# Patient Record
Sex: Female | Born: 2000 | Race: White | Hispanic: No | Marital: Single | Smoking: Never smoker
Health system: Southern US, Community
[De-identification: ages and names within clinical notes are randomized; demographics above are authoritative.]

## PROBLEM LIST (undated history)

## (undated) DIAGNOSIS — N200 Calculus of kidney: Secondary | ICD-10-CM

## (undated) DIAGNOSIS — N390 Urinary tract infection, site not specified: Secondary | ICD-10-CM

## (undated) HISTORY — DX: Urinary tract infection, site not specified: N39.0

## (undated) HISTORY — PX: WISDOM TOOTH EXTRACTION: SHX21

## (undated) HISTORY — DX: Calculus of kidney: N20.0

---
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
## 2019-01-15 ENCOUNTER — Other Ambulatory Visit: Payer: Self-pay

## 2019-01-15 ENCOUNTER — Ambulatory Visit (INDEPENDENT_AMBULATORY_CARE_PROVIDER_SITE_OTHER): Payer: Managed Care, Other (non HMO) | Admitting: Obstetrics and Gynecology

## 2019-01-15 ENCOUNTER — Encounter: Payer: Self-pay | Admitting: Obstetrics and Gynecology

## 2019-01-15 VITALS — BP 100/80 | Ht 64.0 in | Wt 168.0 lb

## 2019-01-15 DIAGNOSIS — N649 Disorder of breast, unspecified: Secondary | ICD-10-CM

## 2019-01-15 DIAGNOSIS — L988 Other specified disorders of the skin and subcutaneous tissue: Secondary | ICD-10-CM

## 2019-01-15 NOTE — Progress Notes (Signed)
Kaylee Bates, Kaylee Bates   Chief Complaint  Patient presents with  . Breast exam    red mark then purple, no lump noticed, tender if touches only since before Thanksgiving    HPI:      Kaylee Bates is a 19 y.o. No obstetric history on file. who LMP was Patient's last menstrual period was 12/26/2018 (exact date)., presents today for NP breast exam. Pt noted a red area on the right breast that started before Thanksgiving. Has since turned purple. Very tender to touch, but doesn't hurt otherwise. No trauma to area, no mass or drainage from area. No nodule at sx onset. Pt tried to squeeze but nothing came out. No fevers, no nipple d/c. No new soaps/detergents. Line dries bras. No hx of breast issues in past. No FH breast/ovar cancer.  Due for annual. She is sex active.   Patient Active Problem List   Diagnosis Date Noted  . Neck pain 09/18/2012  . 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 Nonallopathic lesion of cervical region 09/18/2012  . Nonallopathic lesion of lumbosacral region 09/18/2012  . Nonallopathic lesion of thoracic region 09/18/2012    History reviewed. No pertinent surgical history.  Family History  Problem Relation Age of Onset  . Breast cancer Neg Hx   . Ovarian cancer Neg Hx     Social History   Socioeconomic History  . Marital status: Single    Spouse name: Not on file  . Number of children: Not on file  . Years of education: Not on file  . Highest education level: Not on file  Occupational History  . Not on file  Tobacco Use  . Smoking status: Never Smoker  . Smokeless tobacco: Never Used  Substance and Sexual Activity  . Alcohol use: No  . Drug use: Never  . Sexual activity: Yes    Birth control/protection: Pill  Other Topics Concern  . Not on file  Social History Narrative  . Not on file   Social Determinants of Health   Financial Resource Strain:   . Difficulty of Paying Living Expenses: Not on file  Food Insecurity:     . Worried About Programme researcher, broadcasting/film/video in the Last Year: Not on file  . Ran Out of Food in the Last Year: Not on file  Transportation Needs:   . Lack of Transportation (Medical): Not on file  . Lack of Transportation (Non-Medical): Not on file  Physical Activity:   . Days of Exercise per Week: Not on file  . Minutes of Exercise per Session: Not on file  Stress:   . Feeling of Stress : Not on file  Social Connections:   . Frequency of Communication with Friends and Family: Not on file  . Frequency of Social Gatherings with Friends and Family: Not on file  . Attends Religious Services: Not on file  . Active Member of Clubs or Organizations: Not on file  . Attends Banker Meetings: Not on file  . Marital Status: Not on file  Intimate Partner Violence:   . Fear of Current or Ex-Partner: Not on file  . Emotionally Abused: Not on file  . Physically Abused: Not on file  . Sexually Abused: Not on file    Outpatient Medications Prior to Visit  Medication Sig Dispense Refill  . levonorgestrel-ethinyl estradiol (NORDETTE) 0.15-30 MG-MCG tablet Take by mouth.     No facility-administered medications prior to visit.      ROS:  Review of Systems  Constitutional: Negative for fatigue, fever and unexpected weight change.  Respiratory: Negative for cough, shortness of breath and wheezing.   Cardiovascular: Negative for chest pain, palpitations and leg swelling.  Gastrointestinal: Negative for blood in stool, constipation, diarrhea, nausea and vomiting.  Endocrine: Negative for cold intolerance, heat intolerance and polyuria.  Genitourinary: Negative for dyspareunia, dysuria, flank pain, frequency, genital sores, hematuria, menstrual problem, pelvic pain, urgency, vaginal bleeding, vaginal discharge and vaginal pain.  Musculoskeletal: Negative for back pain, joint swelling and myalgias.  Skin: Negative for rash.  Neurological: Negative for dizziness, syncope, light-headedness,  numbness and headaches.  Hematological: Negative for adenopathy.  Psychiatric/Behavioral: Negative for agitation, confusion, sleep disturbance and suicidal ideas. The patient is not nervous/anxious.    BREAST: tenderness, skin change   OBJECTIVE:   Vitals:  BP 100/80   Ht 5\' 4"  (1.626 m)   Wt 168 lb (76.2 kg)   LMP 12/26/2018 (Exact Date)   BMI 28.84 kg/m   Physical Exam Vitals reviewed.  Pulmonary:     Effort: Pulmonary effort is normal.  Chest:     Breasts: Breasts are symmetrical.        Right: Skin change and tenderness present. No inverted nipple, mass or nipple discharge.        Left: No inverted nipple, mass, nipple discharge, skin change or tenderness.    Musculoskeletal:        General: Normal range of motion.     Cervical back: Normal range of motion.  Skin:    General: Skin is warm and dry.  Neurological:     General: No focal deficit present.     Mental Status: She is alert and oriented to person, place, and time.     Cranial Nerves: No cranial nerve deficit.  Psychiatric:        Mood and Affect: Mood normal.        Behavior: Behavior normal.        Thought Content: Thought content normal.        Judgment: Judgment normal.     Assessment/Plan: Skin lesion of breast--RT breast. Looks like resolving nodule but sx hx not consistent with that and not resolved after 6 wks. Try warm compresses, triple abx oint/lotion to keep moist. Double rinse bras. F/u at annual in 2 wks. If still no change, will refer to derm.     Return in about 2 weeks (around 01/29/2019) for annual/breast f/u.  Mays Paino B. Cashtyn Pouliot, PA-C 01/15/2019 3:50 PM

## 2019-01-15 NOTE — Patient Instructions (Signed)
I value your feedback and entrusting us with your care. If you get a [facility] patient survey, I would appreciate you taking the time to let us know about your experience today. Thank you!  As of December 20, 2018, your lab results will be released to your MyChart immediately, before I even have a chance to see them. Please give me time to review them and contact you if there are any abnormalities. Thank you for your patience.  

## 2019-01-29 ENCOUNTER — Other Ambulatory Visit: Payer: Self-pay

## 2019-01-29 ENCOUNTER — Ambulatory Visit (INDEPENDENT_AMBULATORY_CARE_PROVIDER_SITE_OTHER): Payer: Managed Care, Other (non HMO) | Admitting: Obstetrics and Gynecology

## 2019-01-29 ENCOUNTER — Other Ambulatory Visit (HOSPITAL_COMMUNITY)
Admission: RE | Admit: 2019-01-29 | Discharge: 2019-01-29 | Disposition: A | Discharge status: Home / Self Care | Payer: Managed Care, Other (non HMO) | Source: Ambulatory Visit | Attending: Obstetrics and Gynecology | Admitting: Obstetrics and Gynecology

## 2019-01-29 ENCOUNTER — Encounter: Payer: Self-pay | Admitting: Obstetrics and Gynecology

## 2019-01-29 VITALS — BP 104/64 | Ht 64.0 in | Wt 170.0 lb

## 2019-01-29 DIAGNOSIS — Z01419 Encounter for gynecological examination (general) (routine) without abnormal findings: Secondary | ICD-10-CM

## 2019-01-29 DIAGNOSIS — Z113 Encounter for screening for infections with a predominantly sexual mode of transmission: Secondary | ICD-10-CM | POA: Diagnosis not present

## 2019-01-29 DIAGNOSIS — N649 Disorder of breast, unspecified: Secondary | ICD-10-CM

## 2019-01-29 DIAGNOSIS — Z3041 Encounter for surveillance of contraceptive pills: Secondary | ICD-10-CM

## 2019-01-29 DIAGNOSIS — L988 Other specified disorders of the skin and subcutaneous tissue: Secondary | ICD-10-CM

## 2019-01-29 MED ORDER — LEVONORGESTREL-ETHINYL ESTRAD 0.15-30 MG-MCG PO TABS: 1.0000 | ORAL_TABLET | Freq: Every day | ORAL | 3 refills | Status: DC

## 2019-01-29 NOTE — Patient Instructions (Signed)
I value your feedback and entrusting us with your care. If you get a [facility] patient survey, I would appreciate you taking the time to let us know about your experience today. Thank you!  As of December 20, 2018, your lab results will be released to your MyChart immediately, before I even have a chance to see them. Please give me time to review them and contact you if there are any abnormalities. Thank you for your patience.  

## 2019-01-29 NOTE — Progress Notes (Signed)
PCP:  [facility]   Chief Complaint  Patient presents with  . Gynecologic Exam    HPI:      [name] is a 19 y.o. G0P0000 who LMP was Patient's last menstrual period was 01/23/2019 (exact date)., presents today for her annual examination.  Her menses are regular every 28-30 days, lasting 4 days.  Dysmenorrhea mild, occurring first 1-2 days of flow. She does not have intermenstrual bleeding.  Sex activity: single partner, contraception - OCP (estrogen/progesterone).  Last Pap: N/A due to age Hx of STDs: none   There is no FH of breast cancer. There is no FH of ovarian cancer. The patient does not do self-breast exams. Hx of skin lesion RT breast since Thanksgiving, eval by me 2 wks ago. Due for f/u today. Scabbed area is improving and going away.   Tobacco use: vapes daily, considering quitting Alcohol use: none No drug use.  Exercise: moderately active  She does not get adequate calcium and Vitamin D in her diet. Unsure about Gardasil vaccine.  Patient Active Problem List   Diagnosis Date Noted  . Neck pain 09/18/2012  . 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 Nonallopathic lesion of cervical region 09/18/2012  . Nonallopathic lesion of lumbosacral region 09/18/2012  . Nonallopathic lesion of thoracic region 09/18/2012    History reviewed. No pertinent surgical history.  Family History  Problem Relation Age of Onset  . Breast cancer Neg Hx   . Ovarian cancer Neg Hx     Social History   Socioeconomic History  . Marital status: Single    Spouse name: Not on file  . Number of children: Not on file  . Years of education: Not on file  . Highest education level: Not on file  Occupational History  . Not on file  Tobacco Use  . Smoking status: Never Smoker  . Smokeless tobacco: Never Used  Substance and Sexual Activity  . Alcohol use: No  . Drug use: Never  . Sexual activity: Yes    Birth control/protection: Pill  Other Topics  Concern  . Not on file  Social History Narrative  . Not on file   Social Determinants of Health   Financial Resource Strain:   . Difficulty of Paying Living Expenses: Not on file  Food Insecurity:   . Worried About Programme researcher, broadcasting/film/video in the Last Year: Not on file  . Ran Out of Food in the Last Year: Not on file  Transportation Needs:   . Lack of Transportation (Medical): Not on file  . Lack of Transportation (Non-Medical): Not on file  Physical Activity:   . Days of Exercise per Week: Not on file  . Minutes of Exercise per Session: Not on file  Stress:   . Feeling of Stress : Not on file  Social Connections:   . Frequency of Communication with Friends and Family: Not on file  . Frequency of Social Gatherings with Friends and Family: Not on file  . Attends Religious Services: Not on file  . Active Member of Clubs or Organizations: Not on file  . Attends Banker Meetings: Not on file  . Marital Status: Not on file  Intimate Partner Violence:   . Fear of Current or Ex-Partner: Not on file  . Emotionally Abused: Not on file  . Physically Abused: Not on file  . Sexually Abused: Not on file     Current Outpatient Medications:  .  levonorgestrel-ethinyl  estradiol (NORDETTE) 0.15-30 MG-MCG tablet, Take 1 tablet by mouth daily., Disp: 3 Package, Rfl: 3     ROS:  Review of Systems  Constitutional: Negative for fatigue, fever and unexpected weight change.  Respiratory: Negative for cough, shortness of breath and wheezing.   Cardiovascular: Negative for chest pain, palpitations and leg swelling.  Gastrointestinal: Negative for blood in stool, constipation, diarrhea, nausea and vomiting.  Endocrine: Negative for cold intolerance, heat intolerance and polyuria.  Genitourinary: Negative for dyspareunia, dysuria, flank pain, frequency, genital sores, hematuria, menstrual problem, pelvic pain, urgency, vaginal bleeding, vaginal discharge and vaginal pain.   Musculoskeletal: Negative for back pain, joint swelling and myalgias.  Skin: Negative for rash.  Neurological: Negative for dizziness, syncope, light-headedness, numbness and headaches.  Hematological: Negative for adenopathy.  Psychiatric/Behavioral: Negative for agitation, confusion, sleep disturbance and suicidal ideas. The patient is not nervous/anxious.   BREAST: No symptoms   Objective: BP 104/64   Ht 5\' 4"  (1.626 m)   Wt 170 lb (77.1 kg)   LMP 01/23/2019 (Exact Date)   BMI 29.18 kg/m    Physical Exam Constitutional:      Appearance: She is well-developed.  Genitourinary:     Vulva, vagina, cervix, uterus, right adnexa and left adnexa normal.     No vulval lesion or tenderness noted.     No vaginal discharge, erythema or tenderness.     No cervical polyp.     Uterus is not enlarged or tender.     No right or left adnexal mass present.     Right adnexa not tender.     Left adnexa not tender.  Neck:     Thyroid: No thyromegaly.  Cardiovascular:     Rate and Rhythm: Normal rate and regular rhythm.     Heart sounds: Normal heart sounds. No murmur.  Pulmonary:     Effort: Pulmonary effort is normal.     Breath sounds: Normal breath sounds.  Chest:     Breasts:        Right: No mass, nipple discharge, skin change or tenderness.        Left: No mass, nipple discharge, skin change or tenderness.    Abdominal:     Palpations: Abdomen is soft.     Tenderness: There is no abdominal tenderness. There is no guarding.  Musculoskeletal:        General: Normal range of motion.     Cervical back: Normal range of motion.  Neurological:     General: No focal deficit present.     Mental Status: She is alert and oriented to person, place, and time.     Cranial Nerves: No cranial nerve deficit.  Skin:    General: Skin is warm and dry.  Psychiatric:        Mood and Affect: Mood normal.        Behavior: Behavior normal.        Thought Content: Thought content normal.         Judgment: Judgment normal.  Vitals reviewed.     Assessment/Plan: Encounter for annual routine gynecological examination  Screening for STD (sexually transmitted disease) - Plan: Cervicovaginal ancillary only  Encounter for surveillance of contraceptive pills - Plan: levonorgestrel-ethinyl estradiol (NORDETTE) 0.15-30 MG-MCG tablet; OCP RF  Skin lesion of breast--resolving finally. F/u prn changes.  Meds ordered this encounter  Medications  . levonorgestrel-ethinyl estradiol (NORDETTE) 0.15-30 MG-MCG tablet    Sig: Take 1 tablet by mouth daily.    Dispense:  3  Package    Refill:  3    Order Specific Question:   Supervising Provider    Answer:   Gae Dry [[id number]]             GYN counsel adequate intake of calcium and vitamin D, diet and exercise, Gardasil      F/U  Return in about 1 year (around 01/29/2020).  Jany Buckwalter B. Edna Grover, PA-C 01/29/2019 2:11 PM

## 2019-01-31 LAB — CERVICOVAGINAL ANCILLARY ONLY
Chlamydia: NEGATIVE
Comment: NEGATIVE
Comment: NORMAL
Neisseria Gonorrhea: NEGATIVE

## 2019-03-19 ENCOUNTER — Encounter: Payer: Self-pay | Admitting: Obstetrics and Gynecology

## 2019-03-19 ENCOUNTER — Other Ambulatory Visit: Payer: Self-pay

## 2019-03-19 ENCOUNTER — Ambulatory Visit (INDEPENDENT_AMBULATORY_CARE_PROVIDER_SITE_OTHER): Payer: Managed Care, Other (non HMO) | Admitting: Obstetrics and Gynecology

## 2019-03-19 VITALS — BP 110/70 | Ht 64.0 in | Wt 164.0 lb

## 2019-03-19 DIAGNOSIS — R3915 Urgency of urination: Secondary | ICD-10-CM

## 2019-03-19 DIAGNOSIS — R300 Dysuria: Secondary | ICD-10-CM | POA: Diagnosis not present

## 2019-03-19 DIAGNOSIS — R350 Frequency of micturition: Secondary | ICD-10-CM

## 2019-03-19 DIAGNOSIS — R399 Unspecified symptoms and signs involving the genitourinary system: Secondary | ICD-10-CM

## 2019-03-19 LAB — POCT URINALYSIS DIPSTICK
Bilirubin, UA: NEGATIVE
Glucose, UA: NEGATIVE
Ketones, UA: NEGATIVE
Leukocytes, UA: NEGATIVE
Nitrite, UA: NEGATIVE
Protein, UA: NEGATIVE
Spec Grav, UA: 1.025 (ref 1.010–1.025)
pH, UA: 5 (ref 5.0–8.0)

## 2019-03-19 NOTE — Progress Notes (Signed)
[place]   Chief Complaint  Patient presents with  . Urinary Tract Infection    frequency and burning after urinating, no blood in urine x 1 week    HPI:      [name] is a 19 y.o. G0P0000 who LMP was Patient's last menstrual period was 02/20/2019 (exact date)., presents today for UTI sx of urinary frequency/dysuria/urgency for the past wk. No LBP, pelvic pain, hematuria, fevers, urine odor, vag sx. Sx better today. Pt states gets sx frequently but they resolve after 1-2 days. Only drinks water. Hx of frequent UTIs as a child. Sx not related to sex; usually occur after her period. No vag sx.  She is sex active, no dyspareunia. On OCPs. Starting period this wk but no vag bleeding yet.  Past Medical History:  Diagnosis Date  . UTI (urinary tract infection)    as a child    History reviewed. No pertinent surgical history.  Family History  Problem Relation Age of Onset  . Breast cancer Neg Hx   . Ovarian cancer Neg Hx     Social History   Socioeconomic History  . Marital status: Single    Spouse name: Not on file  . Number of children: Not on file  . Years of education: Not on file  . Highest education level: Not on file  Occupational History  . Not on file  Tobacco Use  . Smoking status: Never Smoker  . Smokeless tobacco: Never Used  Substance and Sexual Activity  . Alcohol use: No  . Drug use: Never  . Sexual activity: Yes    Birth control/protection: Pill  Other Topics Concern  . Not on file  Social History Narrative  . Not on file   Social Determinants of Health   Financial Resource Strain:   . Difficulty of Paying Living Expenses: Not on file  Food Insecurity:   . Worried About Programme researcher, broadcasting/film/video in the Last Year: Not on file  . Ran Out of Food in the Last Year: Not on file  Transportation Needs:   . Lack of Transportation (Medical): Not on file  . Lack of Transportation (Non-Medical): Not on file  Physical Activity:     . Days of Exercise per Week: Not on file  . Minutes of Exercise per Session: Not on file  Stress:   . Feeling of Stress : Not on file  Social Connections:   . Frequency of Communication with Friends and Family: Not on file  . Frequency of Social Gatherings with Friends and Family: Not on file  . Attends Religious Services: Not on file  . Active Member of Clubs or Organizations: Not on file  . Attends Banker Meetings: Not on file  . Marital Status: Not on file  Intimate Partner Violence:   . Fear of Current or Ex-Partner: Not on file  . Emotionally Abused: Not on file  . Physically Abused: Not on file  . Sexually Abused: Not on file    Outpatient Medications Prior to Visit  Medication Sig Dispense Refill  . levonorgestrel-ethinyl estradiol (NORDETTE) 0.15-30 MG-MCG tablet Take 1 tablet by mouth daily. 3 Package 3   No facility-administered medications prior to visit.      ROS:  Review of Systems  Constitutional: Negative for fever.  Gastrointestinal: Negative for blood in stool, constipation, diarrhea, nausea and vomiting.  Genitourinary: Positive for dysuria. Negative for dyspareunia, flank pain, frequency, hematuria, urgency, vaginal bleeding, vaginal  discharge and vaginal pain.  Musculoskeletal: Negative for back pain.  Skin: Negative for rash.  BREAST: No symptoms   OBJECTIVE:   Vitals:  BP 110/70   Ht 5\' 4"  (1.626 m)   Wt 164 lb (74.4 kg)   LMP 02/20/2019 (Exact Date)   BMI 28.15 kg/m   Physical Exam Vitals reviewed.  Constitutional:      Appearance: She is well-developed.  Pulmonary:     Effort: Pulmonary effort is normal.  Musculoskeletal:        General: Normal range of motion.     Cervical back: Normal range of motion.  Skin:    General: Skin is warm and dry.  Neurological:     General: No focal deficit present.     Mental Status: She is alert and oriented to person, place, and time.     Cranial Nerves: No cranial nerve deficit.   Psychiatric:        Mood and Affect: Mood normal.        Behavior: Behavior normal.        Thought Content: Thought content normal.        Judgment: Judgment normal.     Results: Results for orders placed or performed in visit on 03/19/19 (from the past 24 hour(s))  POCT Urinalysis Dipstick     Status: Normal   Collection Time: 03/19/19  3:38 PM  Result Value Ref Range   Color, UA yellow    Clarity, UA clear    Glucose, UA Negative Negative   Bilirubin, UA neg    Ketones, UA neg    Spec Grav, UA 1.025 1.010 - 1.025   Blood, UA trace    pH, UA 5.0 5.0 - 8.0   Protein, UA Negative Negative   Urobilinogen, UA     Nitrite, UA neg    Leukocytes, UA Negative Negative   Appearance     Odor       Assessment/Plan: UTI symptoms - Plan: POCT Urinalysis Dipstick, Urine Culture; Neg UA. Check C&S. Will f/u with results. F/u sooner if sx worsen. If neg, will refer to urology due to frequent sx. Pt to get clarification from mom as to why she had frequent UTIs as a child.    Return if symptoms worsen or fail to improve.  Corry Storie B. Acacia Latorre, PA-C 03/19/2019 3:41 PM

## 2019-03-19 NOTE — Patient Instructions (Signed)
I value your feedback and entrusting us with your care. If you get a [facility] patient survey, I would appreciate you taking the time to let us know about your experience today. Thank you!  As of December 20, 2018, your lab results will be released to your MyChart immediately, before I even have a chance to see them. Please give me time to review them and contact you if there are any abnormalities. Thank you for your patience.  

## 2019-03-21 ENCOUNTER — Telehealth: Payer: Self-pay | Admitting: Obstetrics and Gynecology

## 2019-03-21 LAB — URINE CULTURE

## 2019-03-21 NOTE — Telephone Encounter (Signed)
Patient is calling for labs results. Please advise. 

## 2019-03-21 NOTE — Progress Notes (Signed)
Pt aware. Says sx gone now. Will talk to mom about referral to urology.

## 2019-03-21 NOTE — Progress Notes (Signed)
Pls let pt know C&S neg for UTI. Does she want urology ref since she keeps getting sx? Thx.

## 2019-03-21 NOTE — Telephone Encounter (Signed)
Spoke to pt already.

## 2019-03-21 NOTE — Telephone Encounter (Signed)
Back to you

## 2019-03-21 NOTE — Progress Notes (Signed)
Called pt, no answer, LVMTRC. 

## 2019-07-19 ENCOUNTER — Encounter: Payer: Self-pay | Admitting: Obstetrics and Gynecology

## 2019-07-22 ENCOUNTER — Other Ambulatory Visit: Payer: Self-pay | Admitting: Obstetrics and Gynecology

## 2019-07-22 DIAGNOSIS — Z130 Encounter for screening for diseases of the blood and blood-forming organs and certain disorders involving the immune mechanism: Secondary | ICD-10-CM

## 2019-07-22 DIAGNOSIS — L659 Nonscarring hair loss, unspecified: Secondary | ICD-10-CM

## 2019-07-22 NOTE — Progress Notes (Signed)
Labs for hair loss.

## 2019-07-23 ENCOUNTER — Other Ambulatory Visit: Payer: Self-pay

## 2019-07-23 ENCOUNTER — Other Ambulatory Visit: Payer: Managed Care, Other (non HMO)

## 2019-07-23 DIAGNOSIS — L659 Nonscarring hair loss, unspecified: Secondary | ICD-10-CM

## 2019-07-23 DIAGNOSIS — Z130 Encounter for screening for diseases of the blood and blood-forming organs and certain disorders involving the immune mechanism: Secondary | ICD-10-CM

## 2019-07-24 LAB — IRON,TIBC AND FERRITIN PANEL
Ferritin: 61 ng/mL (ref 15–150)
Iron Saturation: 36 % (ref 15–55)
Iron: 97 ug/dL (ref 27–159)
Total Iron Binding Capacity: 269 ug/dL (ref 250–450)
UIBC: 172 ug/dL (ref 131–425)

## 2019-07-24 LAB — TSH+FREE T4
Free T4: 1.24 ng/dL (ref 0.82–1.77)
TSH: 1.35 u[IU]/mL (ref 0.450–4.500)

## 2020-03-18 ENCOUNTER — Other Ambulatory Visit: Payer: Self-pay | Admitting: Obstetrics and Gynecology

## 2020-03-18 DIAGNOSIS — Z3041 Encounter for surveillance of contraceptive pills: Secondary | ICD-10-CM

## 2020-03-18 MED ORDER — LEVONORGESTREL-ETHINYL ESTRAD 0.15-30 MG-MCG PO TABS: 1.0000 | ORAL_TABLET | Freq: Every day | ORAL | 0 refills | Status: DC

## 2020-03-18 NOTE — Addendum Note (Signed)
Addended by: Loran Senters D on: 03/18/2020 04:01 PM   Modules accepted: Orders

## 2020-03-18 NOTE — Telephone Encounter (Signed)
Pt calling; has scheduled appt for 3/30; needs refill of bc.  [phone] Pt aware refill eRx'd.

## 2020-04-08 ENCOUNTER — Encounter: Payer: Self-pay | Admitting: Obstetrics and Gynecology

## 2020-04-08 ENCOUNTER — Ambulatory Visit (INDEPENDENT_AMBULATORY_CARE_PROVIDER_SITE_OTHER): Payer: 59 | Admitting: Obstetrics and Gynecology

## 2020-04-08 ENCOUNTER — Other Ambulatory Visit (HOSPITAL_COMMUNITY)
Admission: RE | Admit: 2020-04-08 | Discharge: 2020-04-08 | Disposition: A | Discharge status: Home / Self Care | Payer: 59 | Source: Ambulatory Visit | Attending: Obstetrics and Gynecology | Admitting: Obstetrics and Gynecology

## 2020-04-08 ENCOUNTER — Other Ambulatory Visit: Payer: Self-pay

## 2020-04-08 VITALS — BP 120/70 | Ht 64.0 in | Wt 150.0 lb

## 2020-04-08 DIAGNOSIS — Z01419 Encounter for gynecological examination (general) (routine) without abnormal findings: Secondary | ICD-10-CM | POA: Diagnosis not present

## 2020-04-08 DIAGNOSIS — Z113 Encounter for screening for infections with a predominantly sexual mode of transmission: Secondary | ICD-10-CM | POA: Insufficient documentation

## 2020-04-08 DIAGNOSIS — Z3041 Encounter for surveillance of contraceptive pills: Secondary | ICD-10-CM | POA: Diagnosis not present

## 2020-04-08 MED ORDER — LEVONORGESTREL-ETHINYL ESTRAD 0.15-30 MG-MCG PO TABS: 1.0000 | ORAL_TABLET | Freq: Every day | ORAL | 3 refills | Status: DC

## 2020-04-08 NOTE — Patient Instructions (Signed)
I value your feedback and you entrusting us with your care. If you get a [facility] patient survey, I would appreciate you taking the time to let us know about your experience today. Thank you! ? ? ?

## 2020-04-08 NOTE — Progress Notes (Signed)
PCP:  [facility]   Chief Complaint  Patient presents with  . Gynecologic Exam    No concerns    HPI:      [name] is a 20 y.o. G0P0000 who LMP was Patient's last menstrual period was 03/17/2020 (exact date)., presents today for her annual examination.  Her menses are regular every 28-30 days, lasting 2-4 days.  Dysmenorrhea mild, occurring first 1-2 days of flow. She does not have intermenstrual bleeding.  Sex activity: single partner, contraception - OCP (estrogen/progesterone).  Last Pap: N/A due to age Hx of STDs: none   There is no FH of breast cancer. There is no FH of ovarian cancer. The patient does not do self-breast exams.   Tobacco use: none Alcohol use: none No drug use.  Exercise: moderately active  She does get adequate calcium and Vitamin D in her diet. Unsure if Gardasil vaccine done--used to go to [employer].  Patient Active Problem List   Diagnosis Date Noted  . Neck pain 09/18/2012  . 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 Nonallopathic lesion of cervical region 09/18/2012  . Nonallopathic lesion of lumbosacral region 09/18/2012  . Nonallopathic lesion of thoracic region 09/18/2012    History reviewed. No pertinent surgical history.  Family History  Problem Relation Age of Onset  . Bladder Cancer Maternal Grandmother   . Lung cancer Maternal Great-grandmother   . Brain cancer Maternal Great-grandmother   . Breast cancer Neg Hx   . Ovarian cancer Neg Hx     Social History   Socioeconomic History  . Marital status: Single    Spouse name: Not on file  . Number of children: Not on file  . Years of education: Not on file  . Highest education level: Not on file  Occupational History  . Not on file  Tobacco Use  . Smoking status: Never Smoker  . Smokeless tobacco: Never Used  Vaping Use  . Vaping Use: Every day  Substance and Sexual Activity  . Alcohol use: No  . Drug use: Never  . Sexual activity: Yes     Birth control/protection: Pill  Other Topics Concern  . Not on file  Social History Narrative  . Not on file   Social Determinants of Health   Financial Resource Strain: Not on file  Food Insecurity: Not on file  Transportation Needs: Not on file  Physical Activity: Not on file  Stress: Not on file  Social Connections: Not on file  Intimate Partner Violence: Not on file     Current Outpatient Medications:  .  levonorgestrel-ethinyl estradiol (NORDETTE) 0.15-30 MG-MCG tablet, Take 1 tablet by mouth daily., Disp: 84 tablet, Rfl: 3     ROS:  Review of Systems  Constitutional: Negative for fatigue, fever and unexpected weight change.  Respiratory: Negative for cough, shortness of breath and wheezing.   Cardiovascular: Negative for chest pain, palpitations and leg swelling.  Gastrointestinal: Negative for blood in stool, constipation, diarrhea, nausea and vomiting.  Endocrine: Negative for cold intolerance, heat intolerance and polyuria.  Genitourinary: Negative for dyspareunia, dysuria, flank pain, frequency, genital sores, hematuria, menstrual problem, pelvic pain, urgency, vaginal bleeding, vaginal discharge and vaginal pain.  Musculoskeletal: Negative for back pain, joint swelling and myalgias.  Skin: Negative for rash.  Neurological: Negative for dizziness, syncope, light-headedness, numbness and headaches.  Hematological: Negative for adenopathy.  Psychiatric/Behavioral: Negative for agitation, confusion, sleep disturbance and suicidal ideas. The patient is not nervous/anxious.   BREAST:  No symptoms   Objective: BP 120/70   Ht 5\' 4"  (1.626 m)   Wt 150 lb (68 kg)   LMP 03/17/2020 (Exact Date)   BMI 25.75 kg/m    Physical Exam Constitutional:      Appearance: She is well-developed.  Genitourinary:     Vulva normal.     Right Labia: No rash, tenderness or lesions.    Left Labia: No tenderness, lesions or rash.    No vaginal discharge, erythema or tenderness.       Right Adnexa: not tender and no mass present.    Left Adnexa: not tender and no mass present.    No cervical friability or polyp.     Uterus is not enlarged or tender.  Breasts:     Right: No mass, nipple discharge, skin change or tenderness.     Left: No mass, nipple discharge, skin change or tenderness.    Neck:     Thyroid: No thyromegaly.  Cardiovascular:     Rate and Rhythm: Normal rate and regular rhythm.     Heart sounds: Normal heart sounds. No murmur heard.   Pulmonary:     Effort: Pulmonary effort is normal.     Breath sounds: Normal breath sounds.  Abdominal:     Palpations: Abdomen is soft.     Tenderness: There is no abdominal tenderness. There is no guarding or rebound.  Musculoskeletal:        General: Normal range of motion.     Cervical back: Normal range of motion.  Lymphadenopathy:     Cervical: No cervical adenopathy.  Neurological:     General: No focal deficit present.     Mental Status: She is alert and oriented to person, place, and time.     Cranial Nerves: No cranial nerve deficit.  Skin:    General: Skin is warm and dry.  Psychiatric:        Mood and Affect: Mood normal.        Behavior: Behavior normal.        Thought Content: Thought content normal.        Judgment: Judgment normal.  Vitals reviewed.     Assessment/Plan: Encounter for annual routine gynecological examination  Screening for STD (sexually transmitted disease) - Plan: Cervicovaginal ancillary only  Encounter for surveillance of contraceptive pills - Plan: levonorgestrel-ethinyl estradiol (NORDETTE) 0.15-30 MG-MCG tablet; OCP RF   Meds ordered this encounter  Medications  . levonorgestrel-ethinyl estradiol (NORDETTE) 0.15-30 MG-MCG tablet    Sig: Take 1 tablet by mouth daily.    Dispense:  84 tablet    Refill:  3    Order Specific Question:   Supervising Provider    Answer:   05-19-2003 Nadara Mustard             GYN counsel adequate intake of calcium and  vitamin D, diet and exercise, Gardasil--recommended pt get copy of vaccine records from PCP      F/U  Return in about 1 year (around 04/08/2021).  Juanito Gonyer B. Demetrice Combes, PA-C 04/08/2020 2:22 PM

## 2020-04-10 LAB — CERVICOVAGINAL ANCILLARY ONLY
Chlamydia: NEGATIVE
Comment: NEGATIVE
Comment: NORMAL
Neisseria Gonorrhea: NEGATIVE

## 2020-05-25 ENCOUNTER — Emergency Department: Payer: 59

## 2020-05-25 ENCOUNTER — Other Ambulatory Visit: Payer: Self-pay

## 2020-05-25 ENCOUNTER — Encounter: Payer: Self-pay | Admitting: Emergency Medicine

## 2020-05-25 ENCOUNTER — Emergency Department
Admission: EM | Admit: 2020-05-25 | Discharge: 2020-05-25 | Disposition: A | Discharge status: Home / Self Care | Payer: 59 | Attending: Emergency Medicine | Admitting: Emergency Medicine

## 2020-05-25 DIAGNOSIS — M549 Dorsalgia, unspecified: Secondary | ICD-10-CM

## 2020-05-25 DIAGNOSIS — N200 Calculus of kidney: Secondary | ICD-10-CM | POA: Insufficient documentation

## 2020-05-25 DIAGNOSIS — M545 Low back pain, unspecified: Secondary | ICD-10-CM | POA: Diagnosis present

## 2020-05-25 LAB — URINALYSIS, COMPLETE (UACMP) WITH MICROSCOPIC
Bilirubin Urine: NEGATIVE
Glucose, UA: NEGATIVE mg/dL
Ketones, ur: 80 mg/dL — AB
Leukocytes,Ua: NEGATIVE
Nitrite: NEGATIVE
Protein, ur: NEGATIVE mg/dL
RBC / HPF: >50 RBC/hpf — ABNORMAL HIGH (ref 0–5)
Specific Gravity, Urine: 1.026 (ref 1.005–1.030)
pH: 5 (ref 5.0–8.0)

## 2020-05-25 LAB — POC URINE PREG, ED: Preg Test, Ur: NEGATIVE

## 2020-05-25 MED ORDER — ONDANSETRON HCL 4 MG/2ML IJ SOLN
4.0000 mg | Freq: Once | INTRAMUSCULAR | Status: AC
  Administered 2020-05-25: 4 mg via INTRAVENOUS
  Filled 2020-05-25: qty 2

## 2020-05-25 MED ORDER — OXYCODONE-ACETAMINOPHEN 5-325 MG PO TABS: 1.0000 | ORAL_TABLET | ORAL | 0 refills | Status: DC | PRN

## 2020-05-25 MED ORDER — TAMSULOSIN HCL 0.4 MG PO CAPS: 0.4000 mg | ORAL_CAPSULE | Freq: Once | ORAL | 0 refills | Status: AC

## 2020-05-25 MED ORDER — KETOROLAC TROMETHAMINE 10 MG PO TABS: 10.0000 mg | ORAL_TABLET | Freq: Four times a day (QID) | ORAL | 0 refills | Status: DC | PRN

## 2020-05-25 MED ORDER — MORPHINE SULFATE (PF) 4 MG/ML IV SOLN
4.0000 mg | Freq: Once | INTRAVENOUS | Status: AC
  Administered 2020-05-25: 4 mg via INTRAVENOUS
  Filled 2020-05-25: qty 1

## 2020-05-25 MED ORDER — SODIUM CHLORIDE 0.9 % IV BOLUS
1000.0000 mL | Freq: Once | INTRAVENOUS | Status: AC
  Administered 2020-05-25: 1000 mL via INTRAVENOUS

## 2020-05-25 MED ORDER — KETOROLAC TROMETHAMINE 30 MG/ML IJ SOLN
30.0000 mg | Freq: Once | INTRAMUSCULAR | Status: AC
  Administered 2020-05-25: 30 mg via INTRAVENOUS
  Filled 2020-05-25: qty 1

## 2020-05-25 NOTE — Discharge Instructions (Signed)
Follow-up with your regular doctor as needed. Follow-up with Dr. Apolinar Junes if you have not passed the stone by Friday Drink plenty of water Add lemon to your water Take the Toradol and Flomax as prescribed.  Percocet for pain not controlled by these medications

## 2020-05-25 NOTE — ED Notes (Signed)
Pain meds given.   Family with pt.   

## 2020-05-25 NOTE — ED Triage Notes (Signed)
C/O right lower back pain today.  States was painting the garage earlier and pain started this evening.  C/O pain to left side of back.  AAOx3.  Skin warm and dry.  MAE equally and strong.

## 2020-05-25 NOTE — ED Notes (Signed)
See triage note  Presents with left lower back and flank pani  Which started today  Some nausea  No fever

## 2020-05-25 NOTE — ED Provider Notes (Signed)
[facility] Emergency Department Provider Note  ____________________________________________   Event Date/Time   First MD Initiated Contact with Patient 05/25/20 1812     (approximate)  I have reviewed the triage vital signs and the nursing notes.   HISTORY  Chief Complaint Back Pain    HPI Kaylee Bates is a 20 y.o. female presents emergency department complaining of left lower back pain that radiates into the groin.  Patient states it was sudden onset while she was in the garage.  She was painting the garage but does not remember an injury.  No numbness or tingling.  States she is having trouble breathing because of the pain.  No dysuria.  No history of kidney stones    Past Medical History:  Diagnosis Date  . UTI (urinary tract infection)    as a child    Patient Active Problem List   Diagnosis Date Noted  . Neck pain 09/18/2012  . 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 Nonallopathic lesion of cervical region 09/18/2012  . Nonallopathic lesion of lumbosacral region 09/18/2012  . Nonallopathic lesion of thoracic region 09/18/2012    History reviewed. No pertinent surgical history.  Prior to Admission medications   Medication Sig Start Date End Date Taking? Authorizing Provider  ketorolac (TORADOL) 10 MG tablet Take 1 tablet (10 mg total) by mouth every 6 (six) hours as needed. 05/25/20  Yes Kolbie Clarkston, Roselyn Bering, PA-C  oxyCODONE-acetaminophen (PERCOCET) 5-325 MG tablet Take 1 tablet by mouth every 4 (four) hours as needed for severe pain. 05/25/20 05/25/21 Yes Sanari Offner, Roselyn Bering, PA-C  tamsulosin (FLOMAX) 0.4 MG CAPS capsule Take 1 capsule (0.4 mg total) by mouth once for 1 dose. 05/25/20 05/25/20 Yes Larie Mathes, Roselyn Bering, PA-C  levonorgestrel-ethinyl estradiol (NORDETTE) 0.15-30 MG-MCG tablet Take 1 tablet by mouth daily. 04/08/20   Copland, Ilona Sorrel, PA-C    Allergies Patient has no known allergies.  Family History  Problem Relation Age of  Onset  . Bladder Cancer Maternal Grandmother   . Lung cancer Maternal Great-grandmother   . Brain cancer Maternal Great-grandmother   . Breast cancer Neg Hx   . Ovarian cancer Neg Hx     Social History Social History   Tobacco Use  . Smoking status: Never Smoker  . Smokeless tobacco: Never Used  Vaping Use  . Vaping Use: Every day  Substance Use Topics  . Alcohol use: No  . Drug use: Never    Review of Systems  Constitutional: No fever/chills Eyes: No visual changes. ENT: No sore throat. Respiratory: Denies cough Cardiovascular: Denies chest pain Gastrointestinal: pt left lower abdominal pain Genitourinary: Negative for dysuria. Musculoskeletal: Positive for back pain. Skin: Negative for rash. Psychiatric: no mood changes,     ____________________________________________   PHYSICAL EXAM:  VITAL SIGNS: ED Triage Vitals  Enc Vitals Group     BP 05/25/20 1744 105/63     Pulse Rate 05/25/20 1744 75     Resp 05/25/20 1744 15     Temp 05/25/20 1744 98.4 F (36.9 C)     Temp Source 05/25/20 1744 Oral     SpO2 05/25/20 1744 100 %     Weight 05/25/20 1735 149 lb 14.6 oz (68 kg)     Height 05/25/20 1735 5\' 4"  (1.626 m)     Head Circumference --      Peak Flow --      Pain Score 05/25/20 1735 8     Pain Loc --  Pain Edu? --      Excl. in GC? --     Constitutional: Alert and oriented. Well appearing and in no acute distress.  Patient is bracing herself and taking deep breaths due to pain Eyes: Conjunctivae are normal.  Head: Atraumatic. Nose: No congestion/rhinnorhea. Mouth/Throat: Mucous membranes are moist.   Neck:  supple no lymphadenopathy noted Cardiovascular: Normal rate, regular rhythm. Heart sounds are normal Respiratory: Normal respiratory effort.  No retractions, lungs c t a  Abd: soft tender left lower quadrant, bs normal all 4 quad GU: deferred Musculoskeletal: FROM all extremities, warm and well perfused, lumbar spines mildly tender to  palpation Neurologic:  Normal speech and language.  Skin:  Skin is warm, dry and intact. No rash noted. Psychiatric: Mood and affect are normal. Speech and behavior are normal.  ____________________________________________   LABS (all labs ordered are listed, but only abnormal results are displayed)  Labs Reviewed  URINALYSIS, COMPLETE (UACMP) WITH MICROSCOPIC - Abnormal; Notable for the following components:      Result Value   Color, Urine YELLOW (*)    APPearance HAZY (*)    Hgb urine dipstick LARGE (*)    Ketones, ur 80 (*)    RBC / HPF >50 (*)    Bacteria, UA RARE (*)    All other components within normal limits  POC URINE PREG, ED   ____________________________________________   ____________________________________________  RADIOLOGY  CT abdomen/pelvis without contrast, no charge L-spine  ____________________________________________   PROCEDURES  Procedure(s) performed: No  Procedures    ____________________________________________   INITIAL IMPRESSION / ASSESSMENT AND PLAN / ED COURSE  Pertinent labs & imaging results that were available during my care of the patient were reviewed by me and considered in my medical decision making (see chart for details).   Patient is a 20 year old female presents with left-sided flank pain.  See HPI.  Physical exam shows patient appears stable  DDx: Acute lumbar strain, kidney stone, ovarian cyst, pyelonephritis  POC pregnancy is negative, CT abdomen/pelvis without contrast and CT L-spine no charge UA   UA shows hemoglobin and blood   CT reviewed by me.  Radiologist confirms 3 mm stone on the left most likely at the UVJ.  I did explain the findings to the patient and her father.  She states the Toradol did help.  She was given a prescription for Toradol, Flomax, and pain medication.  A strainer to catch the stone.  She is to follow-up with urology if she has not passed the stone by Friday.  She prefers a female so  did assign her to Dr. Vanna Scotland.  She is to return emergency department for worsening.  Add lemon to her water.  She is discharged stable condition  Kaylee Bates was evaluated in Emergency Department on 05/25/2020 for the symptoms described in the history of present illness. She was evaluated in the context of the global COVID-19 pandemic, which necessitated consideration that the patient might be at risk for infection with the SARS-CoV-2 virus that causes COVID-19. Institutional protocols and algorithms that pertain to the evaluation of patients at risk for COVID-19 are in a state of rapid change based on information released by regulatory bodies including the CDC and federal and state organizations. These policies and algorithms were followed during the patient's care in the ED.    As part of my medical decision making, I reviewed the following data within the electronic MEDICAL RECORD NUMBER History obtained from family, Nursing notes reviewed and  incorporated, Labs reviewed , Old chart reviewed, Radiograph reviewed , Notes from prior ED visits and [place] Controlled Substance Database  ____________________________________________   FINAL CLINICAL IMPRESSION(S) / ED DIAGNOSES  Final diagnoses:  Back pain  Kidney stone      NEW MEDICATIONS STARTED DURING THIS VISIT:  New Prescriptions   KETOROLAC (TORADOL) 10 MG TABLET    Take 1 tablet (10 mg total) by mouth every 6 (six) hours as needed.   OXYCODONE-ACETAMINOPHEN (PERCOCET) 5-325 MG TABLET    Take 1 tablet by mouth every 4 (four) hours as needed for severe pain.   TAMSULOSIN (FLOMAX) 0.4 MG CAPS CAPSULE    Take 1 capsule (0.4 mg total) by mouth once for 1 dose.     Note:  This document was prepared using Dragon voice recognition software and may include unintentional dictation errors.    Faythe Ghee, PA-C 05/25/20 2015    Delton Prairie, MD 05/26/20 [phone]

## 2020-06-02 ENCOUNTER — Encounter: Payer: Self-pay | Admitting: Urology

## 2020-06-02 ENCOUNTER — Other Ambulatory Visit: Payer: Self-pay

## 2020-06-02 ENCOUNTER — Ambulatory Visit: Payer: 59 | Admitting: Urology

## 2020-06-02 ENCOUNTER — Telehealth: Payer: Self-pay

## 2020-06-02 ENCOUNTER — Other Ambulatory Visit
Admission: RE | Admit: 2020-06-02 | Discharge: 2020-06-02 | Disposition: A | Discharge status: Home / Self Care | Payer: 59 | Attending: Urology | Admitting: Urology

## 2020-06-02 VITALS — BP 128/84 | HR 112 | Ht 64.0 in | Wt 148.8 lb

## 2020-06-02 DIAGNOSIS — N200 Calculus of kidney: Secondary | ICD-10-CM | POA: Insufficient documentation

## 2020-06-02 LAB — URINALYSIS, COMPLETE (UACMP) WITH MICROSCOPIC
Bilirubin Urine: NEGATIVE
Glucose, UA: NEGATIVE mg/dL
Ketones, ur: NEGATIVE mg/dL
Nitrite: NEGATIVE
Protein, ur: NEGATIVE mg/dL
Specific Gravity, Urine: 1.02 (ref 1.005–1.030)
pH: 7 (ref 5.0–8.0)

## 2020-06-02 NOTE — Patient Instructions (Addendum)
[facility] Instructions [facility]   You will receive a box/kit in the mail that will have a urine jug and instructions in the kit.  When the box arrives you will need to call our office [phone] to schedule a LAB appointment.   You will need to do a 24hour urine and this should be done during the days that our office will be open.  For example any day from Sunday through Thursday.   If you take Vitamin C 181m or greater please stop this 5 days prior to collection.   How to collect the urine sample: On the day you start the urine sample this 1st morning urine should NOT be collected.  For the rest of the day including all night urines should be collected.  On the next morning the 1st urine should be collected and then you will be finished with the urine collections.   You will need to bring the box with you on your LAB appointment day after urine has been collected and all instructions are complete in the box.  Your blood will be drawn and the box will be collected by our Lab employee to be sent off for analysis.   When urine and blood is complete you will need to schedule a follow up appointment for lab results.    Textbook of Natural Medicine (5th ed., pp. [phone]. [place], [place]: Elsevier.">  Dietary Guidelines to Help Prevent Kidney Stones Kidney stones are deposits of minerals and salts that form inside your kidneys. Your risk of developing kidney stones may be greater depending on your diet, your lifestyle, the medicines you take, and whether you have certain medical conditions. Most people can lower their chances of developing kidney stones by following the instructions below. Your dietitian may give you more specific instructions depending on your overall health and the type of kidney stones you tend to develop. What are tips for following this plan? Reading food labels  Choose foods with "no salt added" or "low-salt" labels. Limit your salt (sodium)  intake to less than 1,500 mg a day.  Choose foods with calcium for each meal and snack. Try to eat about 300 mg of calcium at each meal. Foods that contain 200-500 mg of calcium a serving include: ? 8 oz (237 mL) of milk, calcium-fortifiednon-dairy milk, and calcium-fortifiedfruit juice. Calcium-fortified means that calcium has been added to these drinks. ? 8 oz (237 mL) of kefir, yogurt, and soy yogurt. ? 4 oz (114 g) of tofu. ? 1 oz (28 g) of cheese. ? 1 cup (150 g) of dried figs. ? 1 cup (91 g) of cooked broccoli. ? One 3 oz (85 g) can of sardines or mackerel. Most people need 1,000-1,500 mg of calcium a day. Talk to your dietitian about how much calcium is recommended for you.   Shopping  Buy plenty of fresh fruits and vegetables. Most people do not need to avoid fruits and vegetables, even if these foods contain nutrients that may contribute to kidney stones.  When shopping for convenience foods, choose: ? Whole pieces of fruit. ? Pre-made salads with dressing on the side. ? Low-fat fruit and yogurt smoothies.  Avoid buying frozen meals or prepared deli foods. These can be high in sodium.  Look for foods with live cultures, such as yogurt and kefir.  Choose high-fiber grains, such as whole-wheat breads, oat bran, and wheat cereals. Cooking  Do not add salt to food when cooking. Place a salt shaker on the  table and allow each person to add his or her own salt to taste.  Use vegetable protein, such as beans, textured vegetable protein (TVP), or tofu, instead of meat in pasta, casseroles, and soups. Meal planning  Eat less salt, if told by your dietitian. To do this: ? Avoid eating processed or pre-made food. ? Avoid eating fast food.  Eat less animal protein, including cheese, meat, poultry, or fish, if told by your dietitian. To do this: ? Limit the number of times you have meat, poultry, fish, or cheese each week. Eat a diet free of meat at least 2 days a week. ? Eat only  one serving each day of meat, poultry, fish, or seafood. ? When you prepare animal protein, cut pieces into small portion sizes. For most meat and fish, one serving is about the size of the palm of your hand.  Eat at least five servings of fresh fruits and vegetables each day. To do this: ? Keep fruits and vegetables on hand for snacks. ? Eat one piece of fruit or a handful of berries with breakfast. ? Have a salad and fruit at lunch. ? Have two kinds of vegetables at dinner.  Limit foods that are high in a substance called oxalate. These include: ? Spinach (cooked), rhubarb, beets, sweet potatoes, and Swiss chard. ? Peanuts. ? Potato chips, french fries, and baked potatoes with skin on. ? Nuts and nut products. ? Chocolate.  If you regularly take a diuretic medicine, make sure to eat at least 1 or 2 servings of fruits or vegetables that are high in potassium each day. These include: ? Avocado. ? Banana. ? Orange, prune, carrot, or tomato juice. ? Baked potato. ? Cabbage. ? Beans and split peas. Lifestyle  Drink enough fluid to keep your urine pale yellow. This is the most important thing you can do. Spread your fluid intake throughout the day.  If you drink alcohol: ? Limit how much you use to:  0-1 drink a day for women who are not pregnant.  0-2 drinks a day for men. ? Be aware of how much alcohol is in your drink. In the [place] one drink equals one 12 oz bottle of beer (355 mL), one 5 oz glass of wine (148 mL), or one 1 oz glass of hard liquor (44 mL).  Lose weight if told by your health care provider. Work with your dietitian to find an eating plan and weight loss strategies that work best for you.   General information  Talk to your health care provider and dietitian about taking daily supplements. You may be told the following depending on your health and the cause of your kidney stones: ? Not to take supplements with vitamin C. ? To take a calcium supplement. ? To  take a daily probiotic supplement. ? To take other supplements such as magnesium, fish oil, or vitamin B6.  Take over-the-counter and prescription medicines only as told by your health care provider. These include supplements. What foods should I limit? Limit your intake of the following foods, or eat them as told by your dietitian. Vegetables Spinach. Rhubarb. Beets. Canned vegetables. Pickles. Olives. Baked potatoes with skin. Grains Wheat bran. Baked goods. Salted crackers. Cereals high in sugar. Meats and other proteins Nuts. Nut butters. Large portions of meat, poultry, or fish. Salted, precooked, or cured meats, such as sausages, meat loaves, and hot dogs. Dairy Cheese. Beverages Regular soft drinks. Regular vegetable juice. Seasonings and condiments Seasoning blends with salt. Salad   dressings. Soy sauce. Ketchup. Barbecue sauce. Other foods Canned soups. Canned pasta sauce. Casseroles. Pizza. Lasagna. Frozen meals. Potato chips. [place] fries. The items listed above may not be a complete list of foods and beverages you should limit. Contact a dietitian for more information. What foods should I avoid? Talk to your dietitian about specific foods you should avoid based on the type of kidney stones you have and your overall health. Fruits Grapefruit. The item listed above may not be a complete list of foods and beverages you should avoid. Contact a dietitian for more information. Summary  Kidney stones are deposits of minerals and salts that form inside your kidneys.  You can lower your risk of kidney stones by making changes to your diet.  The most important thing you can do is drink enough fluid. Drink enough fluid to keep your urine pale yellow.  Talk to your dietitian about how much calcium you should have each day, and eat less salt and animal protein as told by your dietitian. This information is not intended to replace advice given to you by your health care provider. Make  sure you discuss any questions you have with your health care provider. Document Revised: 12/20/2018 Document Reviewed: 12/20/2018 Elsevier Patient Education  2021 [employer].

## 2020-06-02 NOTE — Telephone Encounter (Signed)
See my chart message

## 2020-06-02 NOTE — Progress Notes (Signed)
   06/02/20 12:59 PM   Levonne Hubert [birth year] [id number]  CC: Left distal ureteral stone  HPI: Ms. Roylance is a healthy 20 year old female who presented to the ED on 05/25/2020 with acute onset of left-sided flank pain.  CT showed a 3 mm left distal ureteral stone and urinalysis showed microscopic hematuria but no evidence of infection and she was discharged with medical expulsive therapy.  She reports her pain has resolved over the last few days and denies any complaints or gross hematuria today.  She denies any prior episodes of stones.  Urinalysis today benign with no microscopic hematuria or WBCs.  PMH: Past Medical History:  Diagnosis Date  . Kidney stone   . UTI (urinary tract infection)    as a child    Family History: Family History  Problem Relation Age of Onset  . Bladder Cancer Maternal Grandmother   . Lung cancer Maternal Great-grandmother   . Brain cancer Maternal Great-grandmother   . Breast cancer Neg Hx   . Ovarian cancer Neg Hx     Social History:  reports that she has never smoked. She has never used smokeless tobacco. She reports that she does not drink alcohol and does not use drugs.  Physical Exam: BP 128/84 (BP Location: Left Arm, Patient Position: Sitting, Cuff Size: Normal)   Pulse (!) 112   Ht 5\' 4"  (1.626 m)   Wt 148 lb 12.8 oz (67.5 kg)   LMP 05/13/2020   BMI 25.54 kg/m    Constitutional:  Alert and oriented, No acute distress. Cardiovascular: No clubbing, cyanosis, or edema. Respiratory: Normal respiratory effort, no increased work of breathing. GI: Abdomen is soft, nontender, nondistended, no abdominal masses   Laboratory Data: Reviewed  Pertinent Imaging: I have personally viewed and interpreted the CT 05/25/2020 showing a 3 mm left distal ureteral stone with upstream hydronephrosis, and 2 punctate right nonobstructive renal stones  Assessment & Plan:   Healthy 20 year old female with recent spontaneously passed a 3 mm left distal  ureteral stone.  She has no pain today, and urinalysis is completely benign.  I recommended a metabolic work-up with 24-hour urine for further evaluation with her young age.  We discussed general stone prevention strategies including adequate hydration with goal of producing 2.5 L of urine daily, increasing citric acid intake, increasing calcium intake during high oxalate meals, minimizing animal protein, and decreasing salt intake. Information about dietary recommendations given today.   Follow-up for 24-hour urine results  36, MD 06/02/2020  [facility] [address] [place], [place] [phone]

## 2020-06-02 NOTE — Telephone Encounter (Signed)
-----   Message from Sondra Come, MD sent at 06/02/2020  1:00 PM EDT ----- Urinalysis today looks good, no microscopic blood or white cells.  She likely passed her stone spontaneously.  Keep follow-up as scheduled for 24-hour urine results

## 2020-08-04 ENCOUNTER — Ambulatory Visit: Payer: 59 | Admitting: Urology

## 2021-04-18 NOTE — Progress Notes (Signed)
? ?PCP:  [place] ? ? ?Chief Complaint  ?Patient presents with  ? Gynecologic Exam  ?  No concerns  ? ? ?HPI: ?     [name] is a 21 y.o. G0P0000 who LMP was Patient's last menstrual period was 04/08/2021 (exact date)., presents today for her annual examination.  Her menses are regular every 28-30 days, lasting 3-4 days.  Dysmenorrhea mild, occurring first 1-2 days of flow. She does not have intermenstrual bleeding. ? ?Sex activity: not currently, contraception - OCP (estrogen/progesterone). No pain/bleeding ?Last Pap: N/A due to age ?Hx of STDs: none  ? ?There is no FH of breast cancer. There is no FH of ovarian cancer. The patient does not do self-breast exams.  ? ?Tobacco use: none ?Alcohol use: wknds ?No drug use.  ?Exercise: moderately active ? ?She does get adequate calcium and sometimes Vitamin D in her diet. ?Unsure if Gardasil vaccine done--used to go to [employer]. ? ?Pt has noticed tachycardic episodes recently. She feels her heart beating harder and her watch says HR=120/125. Sx occur at rest or just sitting. Doesn't notice skipped beats. No caffeine use, no exercise intolerance.  ? ?Patient Active Problem List  ? Diagnosis Date Noted  ? Menorrhagia with regular cycle 05/27/2015  ? Neck pain 09/18/2012  ? 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 Nonallopathic lesion of cervical region 09/18/2012  ? Nonallopathic lesion of lumbosacral region 09/18/2012  ? Nonallopathic lesion of thoracic region 09/18/2012  ? Biomechanical lesion, unspecified 09/18/2012  ? Sacrococcygeal disorders, not elsewhere classified 09/18/2012  ? Segmental and somatic dysfunction of thoracic region 09/18/2012  ? ? ?Past Surgical History:  ?Procedure Laterality Date  ? WISDOM TOOTH EXTRACTION    ? ? ?Family History  ?Problem Relation Age of Onset  ? Bladder Cancer Maternal Grandmother   ? Lung cancer Maternal Great-grandmother   ? Brain cancer Maternal Great-grandmother   ? Breast cancer Neg Hx   ?  Ovarian cancer Neg Hx   ? ? ?Social History  ? ?Socioeconomic History  ? Marital status: Single  ?  Spouse name: Not on file  ? Number of children: Not on file  ? Years of education: Not on file  ? Highest education level: Not on file  ?Occupational History  ? Not on file  ?Tobacco Use  ? Smoking status: Never  ? Smokeless tobacco: Never  ?Vaping Use  ? Vaping Use: Every day  ?Substance and Sexual Activity  ? Alcohol use: No  ? Drug use: Never  ? Sexual activity: Not Currently  ?  Birth control/protection: Pill  ?Other Topics Concern  ? Not on file  ?Social History Narrative  ? Not on file  ? ?Social Determinants of Health  ? ?Financial Resource Strain: Not on file  ?Food Insecurity: Not on file  ?Transportation Needs: Not on file  ?Physical Activity: Not on file  ?Stress: Not on file  ?Social Connections: Not on file  ?Intimate Partner Violence: Not on file  ? ? ? ?Current Outpatient Medications:  ?  azelastine (ASTELIN) 0.1 % nasal spray, Place into both nostrils., Disp: , Rfl:  ?  EPINEPHrine 0.3 mg/0.3 mL IJ SOAJ injection, SMARTSIG:0.3 Milliliter(s) IM Once PRN, Disp: , Rfl:  ?  fluticasone (FLONASE) 50 MCG/ACT nasal spray, Place into both nostrils., Disp: , Rfl:  ?  levocetirizine (XYZAL) 5 MG tablet, Take 5 mg by mouth daily., Disp: , Rfl:  ?  montelukast (SINGULAIR) 10 MG tablet, Take 10  mg by mouth daily., Disp: , Rfl:  ?  levonorgestrel-ethinyl estradiol (NORDETTE) 0.15-30 MG-MCG tablet, Take 1 tablet by mouth daily., Disp: 84 tablet, Rfl: 3 ? ? ? ? ?ROS: ? ?Review of Systems  ?Constitutional:  Negative for fatigue, fever and unexpected weight change.  ?Respiratory:  Negative for cough, shortness of breath and wheezing.   ?Cardiovascular:  Negative for chest pain, palpitations and leg swelling.  ?Gastrointestinal:  Negative for blood in stool, constipation, diarrhea, nausea and vomiting.  ?Endocrine: Negative for cold intolerance, heat intolerance and polyuria.  ?Genitourinary:  Negative for dyspareunia,  dysuria, flank pain, frequency, genital sores, hematuria, menstrual problem, pelvic pain, urgency, vaginal bleeding, vaginal discharge and vaginal pain.  ?Musculoskeletal:  Negative for back pain, joint swelling and myalgias.  ?Skin:  Negative for rash.  ?Neurological:  Negative for dizziness, syncope, light-headedness, numbness and headaches.  ?Hematological:  Negative for adenopathy.  ?Psychiatric/Behavioral:  Negative for agitation, confusion, sleep disturbance and suicidal ideas. The patient is not nervous/anxious.  BREAST: No symptoms ? ? ?Objective: ?BP 120/80   Ht 5\' 3"  (1.6 m)   Wt 174 lb (78.9 kg)   LMP 04/08/2021 (Exact Date)   BMI 30.82 kg/m?  ? ? ?Physical Exam ?Constitutional:   ?   Appearance: She is well-developed.  ?Genitourinary:  ?   Vulva normal.  ?   Right Labia: No rash, tenderness or lesions. ?   Left Labia: No tenderness, lesions or rash. ?   No vaginal discharge, erythema or tenderness.  ? ?   Right Adnexa: not tender and no mass present. ?   Left Adnexa: not tender and no mass present. ?   No cervical friability or polyp.  ?   Uterus is not enlarged or tender.  ?Breasts: ?   Right: No mass, nipple discharge, skin change or tenderness.  ?   Left: No mass, nipple discharge, skin change or tenderness.  ?Neck:  ?   Thyroid: No thyromegaly.  ?Cardiovascular:  ?   Rate and Rhythm: Normal rate and regular rhythm.  ?   Heart sounds: Normal heart sounds. No murmur heard. ?Pulmonary:  ?   Effort: Pulmonary effort is normal.  ?   Breath sounds: Normal breath sounds.  ?Abdominal:  ?   Palpations: Abdomen is soft.  ?   Tenderness: There is no abdominal tenderness. There is no guarding or rebound.  ?Musculoskeletal:     ?   General: Normal range of motion.  ?   Cervical back: Normal range of motion.  ?Lymphadenopathy:  ?   Cervical: No cervical adenopathy.  ?Neurological:  ?   General: No focal deficit present.  ?   Mental Status: She is alert and oriented to person, place, and time.  ?   Cranial  Nerves: No cranial nerve deficit.  ?Skin: ?   General: Skin is warm and dry.  ?Psychiatric:     ?   Mood and Affect: Mood normal.     ?   Behavior: Behavior normal.     ?   Thought Content: Thought content normal.     ?   Judgment: Judgment normal.  ?Vitals reviewed.  ? ? ?Assessment/Plan: ?Encounter for annual routine gynecological examination ? ?Cervical cancer screening - Plan: Cytology - PAP ? ?Screening for STD (sexually transmitted disease) - Plan: Cytology - PAP ? ?Encounter for surveillance of contraceptive pills - Plan: levonorgestrel-ethinyl estradiol (NORDETTE) 0.15-30 MG-MCG tablet; OCP RF ? ?Blood tests for routine general physical examination - Plan: Comprehensive metabolic panel, TSH +  free T4 ? ?Thyroid disorder screening - Plan: TSH + free T4 ? ?Tachycardia - Plan: TSH + free T4; check labs. Pt to have pulse and BP checked at work next episode and f/u with PCP.  ? ?Meds ordered this encounter  ?Medications  ? levonorgestrel-ethinyl estradiol (NORDETTE) 0.15-30 MG-MCG tablet  ?  Sig: Take 1 tablet by mouth daily.  ?  Dispense:  84 tablet  ?  Refill:  3  ?  Order Specific Question:   Supervising Provider  ?  AnswerNadara Mustard [[id number]]  ? ?          ?GYN counsel adequate intake of calcium and vitamin D, diet and exercise ? ? ?  F/U ? Return in about 1 year (around 04/20/2022). ? ?Belinda Schlichting B. Mosetta Ferdinand, PA-C ?04/19/2021 ?9:27 AM ?

## 2021-04-19 ENCOUNTER — Other Ambulatory Visit (HOSPITAL_COMMUNITY)
Admission: RE | Admit: 2021-04-19 | Discharge: 2021-04-19 | Disposition: A | Discharge status: Home / Self Care | Payer: Managed Care, Other (non HMO) | Source: Ambulatory Visit | Attending: Obstetrics and Gynecology | Admitting: Obstetrics and Gynecology

## 2021-04-19 ENCOUNTER — Ambulatory Visit (INDEPENDENT_AMBULATORY_CARE_PROVIDER_SITE_OTHER): Payer: 59 | Admitting: Obstetrics and Gynecology

## 2021-04-19 ENCOUNTER — Encounter: Payer: Self-pay | Admitting: Obstetrics and Gynecology

## 2021-04-19 VITALS — BP 120/80 | Ht 63.0 in | Wt 174.0 lb

## 2021-04-19 DIAGNOSIS — Z0000 Encounter for general adult medical examination without abnormal findings: Secondary | ICD-10-CM

## 2021-04-19 DIAGNOSIS — R000 Tachycardia, unspecified: Secondary | ICD-10-CM

## 2021-04-19 DIAGNOSIS — Z124 Encounter for screening for malignant neoplasm of cervix: Secondary | ICD-10-CM

## 2021-04-19 DIAGNOSIS — Z1329 Encounter for screening for other suspected endocrine disorder: Secondary | ICD-10-CM

## 2021-04-19 DIAGNOSIS — Z113 Encounter for screening for infections with a predominantly sexual mode of transmission: Secondary | ICD-10-CM | POA: Diagnosis not present

## 2021-04-19 DIAGNOSIS — Z01419 Encounter for gynecological examination (general) (routine) without abnormal findings: Secondary | ICD-10-CM | POA: Diagnosis not present

## 2021-04-19 DIAGNOSIS — Z3041 Encounter for surveillance of contraceptive pills: Secondary | ICD-10-CM

## 2021-04-19 MED ORDER — LEVONORGESTREL-ETHINYL ESTRAD 0.15-30 MG-MCG PO TABS: 1.0000 | ORAL_TABLET | Freq: Every day | ORAL | 3 refills | Status: DC

## 2021-04-19 NOTE — Patient Instructions (Signed)
I value your feedback and you entrusting us with your care. If you get a [facility] patient survey, I would appreciate you taking the time to let us know about your experience today. Thank you! ? ? ?

## 2021-04-20 LAB — COMPREHENSIVE METABOLIC PANEL
ALT: 13 IU/L (ref 0–32)
AST: 16 IU/L (ref 0–40)
Albumin/Globulin Ratio: 2.2 (ref 1.2–2.2)
Albumin: 4.7 g/dL (ref 3.9–5.0)
Alkaline Phosphatase: 61 IU/L (ref 44–121)
BUN/Creatinine Ratio: 17 (ref 9–23)
BUN: 13 mg/dL (ref 6–20)
Bilirubin Total: 0.3 mg/dL (ref 0.0–1.2)
CO2: 20 mmol/L (ref 20–29)
Calcium: 9.5 mg/dL (ref 8.7–10.2)
Chloride: 105 mmol/L (ref 96–106)
Creatinine, Ser: 0.78 mg/dL (ref 0.57–1.00)
Globulin, Total: 2.1 g/dL (ref 1.5–4.5)
Glucose: 89 mg/dL (ref 70–99)
Potassium: 4.3 mmol/L (ref 3.5–5.2)
Sodium: 139 mmol/L (ref 134–144)
Total Protein: 6.8 g/dL (ref 6.0–8.5)
eGFR: 111 mL/min/{1.73_m2} (ref >59)

## 2021-04-20 LAB — TSH+FREE T4
Free T4: 1.31 ng/dL (ref 0.82–1.77)
TSH: 1.64 u[IU]/mL (ref 0.450–4.500)

## 2021-04-21 LAB — CYTOLOGY - PAP
Chlamydia: NEGATIVE
Comment: NEGATIVE
Comment: NORMAL
Diagnosis: NEGATIVE
Neisseria Gonorrhea: NEGATIVE

## 2021-04-22 ENCOUNTER — Encounter: Payer: Self-pay | Admitting: Obstetrics and Gynecology

## 2021-11-18 ENCOUNTER — Encounter: Payer: Self-pay | Admitting: Obstetrics and Gynecology

## 2022-02-14 NOTE — Progress Notes (Unsigned)
    [place]   No chief complaint on file.   HPI:      [name] is a 22 y.o. G0P0000 whose LMP was No LMP recorded., presents today for ***  Her menses are regular every 28-30 days, lasting 3-4 days.  Dysmenorrhea mild, occurring first 1-2 days of flow. She does not have intermenstrual bleeding.   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   Nonallopathic lesion of cervical region 09/18/2012   Nonallopathic lesion of lumbosacral region 09/18/2012   Nonallopathic lesion of thoracic region 09/18/2012   Biomechanical lesion, unspecified 09/18/2012   Sacrococcygeal disorders, not elsewhere classified 09/18/2012   Segmental and somatic dysfunction of thoracic region 09/18/2012    Past Surgical History:  Procedure Laterality Date   WISDOM TOOTH EXTRACTION      Family History  Problem Relation Age of Onset   Bladder Cancer Maternal Grandmother    Lung cancer Maternal Great-grandmother    Brain cancer Maternal Great-grandmother    Breast cancer Neg Hx    Ovarian cancer Neg Hx     Social History   Socioeconomic History   Marital status: Single    Spouse name: Not on file   Number of children: Not on file   Years of education: Not on file   Highest education level: Not on file  Occupational History   Not on file  Tobacco Use   Smoking status: Never   Smokeless tobacco: Never  Vaping Use   Vaping Use: Every day  Substance and Sexual Activity   Alcohol use: No   Drug use: Never   Sexual activity: Not Currently    Birth control/protection: Pill  Other Topics Concern   Not on file  Social History Narrative   Not on file   Social Determinants of Health   Financial Resource Strain: Not on file  Food Insecurity: Not on file  Transportation Needs: Not on file  Physical Activity: Not on file  Stress: Not on file   Social Connections: Not on file  Intimate Partner Violence: Not on file    Outpatient Medications Prior to Visit  Medication Sig Dispense Refill   azelastine (ASTELIN) 0.1 % nasal spray Place into both nostrils.     EPINEPHrine 0.3 mg/0.3 mL IJ SOAJ injection SMARTSIG:0.3 Milliliter(s) IM Once PRN     fluticasone (FLONASE) 50 MCG/ACT nasal spray Place into both nostrils.     levocetirizine (XYZAL) 5 MG tablet Take 5 mg by mouth daily.     levonorgestrel-ethinyl estradiol (NORDETTE) 0.15-30 MG-MCG tablet Take 1 tablet by mouth daily. 84 tablet 3   montelukast (SINGULAIR) 10 MG tablet Take 10 mg by mouth daily.     No facility-administered medications prior to visit.      ROS:  Review of Systems BREAST: No symptoms   OBJECTIVE:   Vitals:  There were no vitals taken for this visit.  Physical Exam  Results: No results found for this or any previous visit (from the past 24 hour(s)).   Assessment/Plan: No diagnosis found.    No orders of the defined types were placed in this encounter.     No follow-ups on file.  Dezmond Downie B. Tarryn Bogdan, PA-C 02/14/2022 5:06 PM

## 2022-02-15 ENCOUNTER — Encounter: Payer: Self-pay | Admitting: Obstetrics and Gynecology

## 2022-02-15 ENCOUNTER — Ambulatory Visit: Payer: Managed Care, Other (non HMO) | Admitting: Obstetrics and Gynecology

## 2022-02-15 VITALS — BP 110/70 | Ht 64.0 in | Wt 177.0 lb

## 2022-02-15 DIAGNOSIS — N941 Unspecified dyspareunia: Secondary | ICD-10-CM

## 2022-02-15 DIAGNOSIS — Z3009 Encounter for other general counseling and advice on contraception: Secondary | ICD-10-CM

## 2022-02-15 DIAGNOSIS — Z30014 Encounter for initial prescription of intrauterine contraceptive device: Secondary | ICD-10-CM

## 2022-02-15 MED ORDER — MISOPROSTOL 100 MCG PO TABS: 100.0000 ug | ORAL_TABLET | Freq: Once | ORAL | 0 refills | Status: DC

## 2022-03-21 ENCOUNTER — Other Ambulatory Visit: Payer: Self-pay | Admitting: Obstetrics and Gynecology

## 2022-03-21 ENCOUNTER — Other Ambulatory Visit: Payer: Self-pay

## 2022-03-21 DIAGNOSIS — Z3041 Encounter for surveillance of contraceptive pills: Secondary | ICD-10-CM

## 2022-03-21 MED ORDER — LEVONORGESTREL-ETHINYL ESTRAD 0.15-30 MG-MCG PO TABS: 1.0000 | ORAL_TABLET | Freq: Every day | ORAL | 3 refills | Status: DC

## 2022-04-26 NOTE — Progress Notes (Unsigned)
PCP:  [facility]   No chief complaint on file.   HPI:      [name] is a 22 y.o. G0P0000 who LMP was No LMP recorded., presents today for her annual examination.  Her menses are regular every 28-30 days, lasting 3-4 days.  Dysmenorrhea mild, occurring first 1-2 days of flow. She does not have intermenstrual bleeding.  Sex activity: not currently, contraception - OCP (estrogen/progesterone). No pain/bleeding. Had discussed IUD at 2/24 appt.  Last Pap: 04/19/21 Results were no abnormalities Hx of STDs: none   There is no FH of breast cancer. There is no FH of ovarian cancer. The patient does not do self-breast exams.   Tobacco use: none Alcohol use: wknds No drug use.  Exercise: moderately active  She does get adequate calcium and sometimes Vitamin D in her diet. Unsure if Gardasil vaccine done--used to go to [employer].  Pt has noticed tachycardic episodes recently. She feels her heart beating harder and her watch says HR=120/125. Sx occur at rest or just sitting. Doesn't notice skipped beats. No caffeine use, no exercise intolerance.   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   Nonallopathic lesion of cervical region 09/18/2012   Nonallopathic lesion of lumbosacral region 09/18/2012   Nonallopathic lesion of thoracic region 09/18/2012   Biomechanical lesion, unspecified 09/18/2012   Sacrococcygeal disorders, not elsewhere classified 09/18/2012   Segmental and somatic dysfunction of thoracic region 09/18/2012    Past Surgical History:  Procedure Laterality Date   WISDOM TOOTH EXTRACTION      Family History  Problem Relation Age of Onset   Bladder Cancer Maternal Grandmother    Lung cancer Maternal Great-grandmother    Brain cancer Maternal Great-grandmother    Breast cancer Neg Hx    Ovarian cancer Neg Hx     Social  History   Socioeconomic History   Marital status: Single    Spouse name: Not on file   Number of children: Not on file   Years of education: Not on file   Highest education level: Not on file  Occupational History   Not on file  Tobacco Use   Smoking status: Never   Smokeless tobacco: Never  Vaping Use   Vaping Use: Every day  Substance and Sexual Activity   Alcohol use: No   Drug use: Never   Sexual activity: Yes    Birth control/protection: Pill  Other Topics Concern   Not on file  Social History Narrative   Not on file   Social Determinants of Health   Financial Resource Strain: Not on file  Food Insecurity: Not on file  Transportation Needs: Not on file  Physical Activity: Not on file  Stress: Not on file  Social Connections: Not on file  Intimate Partner Violence: Not on file     Current Outpatient Medications:    azelastine (OPTIVAR) 0.05 % ophthalmic solution, Apply to eye., Disp: , Rfl:    EPINEPHrine 0.3 mg/0.3 mL IJ SOAJ injection, SMARTSIG:0.3 Milliliter(s) IM Once PRN, Disp: , Rfl:    levocetirizine (XYZAL) 5 MG tablet, Take 5 mg by mouth daily., Disp: , Rfl:    levonorgestrel-ethinyl estradiol (NORDETTE) 0.15-30 MG-MCG tablet, Take 1 tablet by mouth daily., Disp: 84 tablet, Rfl: 3   misoprostol (CYTOTEC) 100 MCG tablet, Take 1 tablet (100 mcg total) by mouth once  for 1 dose. 1 hour before appt, Disp: 1 tablet, Rfl: 0   montelukast (SINGULAIR) 10 MG tablet, Take 10 mg by mouth daily., Disp: , Rfl:      ROS:  Review of Systems  Constitutional:  Negative for fatigue, fever and unexpected weight change.  Respiratory:  Negative for cough, shortness of breath and wheezing.   Cardiovascular:  Negative for chest pain, palpitations and leg swelling.  Gastrointestinal:  Negative for blood in stool, constipation, diarrhea, nausea and vomiting.  Endocrine: Negative for cold intolerance, heat intolerance and polyuria.  Genitourinary:  Negative for dyspareunia,  dysuria, flank pain, frequency, genital sores, hematuria, menstrual problem, pelvic pain, urgency, vaginal bleeding, vaginal discharge and vaginal pain.  Musculoskeletal:  Negative for back pain, joint swelling and myalgias.  Skin:  Negative for rash.  Neurological:  Negative for dizziness, syncope, light-headedness, numbness and headaches.  Hematological:  Negative for adenopathy.  Psychiatric/Behavioral:  Negative for agitation, confusion, sleep disturbance and suicidal ideas. The patient is not nervous/anxious.   BREAST: No symptoms   Objective: There were no vitals taken for this visit.   Physical Exam Constitutional:      Appearance: She is well-developed.  Genitourinary:     Vulva normal.     Right Labia: No rash, tenderness or lesions.    Left Labia: No tenderness, lesions or rash.    No vaginal discharge, erythema or tenderness.      Right Adnexa: not tender and no mass present.    Left Adnexa: not tender and no mass present.    No cervical friability or polyp.     Uterus is not enlarged or tender.  Breasts:    Right: No mass, nipple discharge, skin change or tenderness.     Left: No mass, nipple discharge, skin change or tenderness.  Neck:     Thyroid: No thyromegaly.  Cardiovascular:     Rate and Rhythm: Normal rate and regular rhythm.     Heart sounds: Normal heart sounds. No murmur heard. Pulmonary:     Effort: Pulmonary effort is normal.     Breath sounds: Normal breath sounds.  Abdominal:     Palpations: Abdomen is soft.     Tenderness: There is no abdominal tenderness. There is no guarding or rebound.  Musculoskeletal:        General: Normal range of motion.     Cervical back: Normal range of motion.  Lymphadenopathy:     Cervical: No cervical adenopathy.  Neurological:     General: No focal deficit present.     Mental Status: She is alert and oriented to person, place, and time.     Cranial Nerves: No cranial nerve deficit.  Skin:    General: Skin is  warm and dry.  Psychiatric:        Mood and Affect: Mood normal.        Behavior: Behavior normal.        Thought Content: Thought content normal.        Judgment: Judgment normal.  Vitals reviewed.     Assessment/Plan: Encounter for annual routine gynecological examination  Cervical cancer screening - Plan: Cytology - PAP  Screening for STD (sexually transmitted disease) - Plan: Cytology - PAP  Encounter for surveillance of contraceptive pills - Plan: levonorgestrel-ethinyl estradiol (NORDETTE) 0.15-30 MG-MCG tablet; OCP RF  Blood tests for routine general physical examination - Plan: Comprehensive metabolic panel, TSH + free T4  Thyroid disorder screening - Plan: TSH + free T4  Tachycardia -  Plan: TSH + free T4; check labs. Pt to have pulse and BP checked at work next episode and f/u with PCP.   No orders of the defined types were placed in this encounter.            GYN counsel adequate intake of calcium and vitamin D, diet and exercise     F/U  No follow-ups on file.  Raven Furnas B. Merced Hanners, PA-C 04/26/2022 11:13 AM

## 2022-04-28 ENCOUNTER — Ambulatory Visit: Payer: Managed Care, Other (non HMO) | Admitting: Obstetrics and Gynecology

## 2022-04-28 ENCOUNTER — Other Ambulatory Visit (HOSPITAL_COMMUNITY)
Admission: RE | Admit: 2022-04-28 | Discharge: 2022-04-28 | Disposition: A | Discharge status: Home / Self Care | Payer: Managed Care, Other (non HMO) | Source: Ambulatory Visit | Attending: Obstetrics and Gynecology | Admitting: Obstetrics and Gynecology

## 2022-04-28 ENCOUNTER — Encounter: Payer: Self-pay | Admitting: Obstetrics and Gynecology

## 2022-04-28 VITALS — BP 110/70 | Ht 64.0 in | Wt 169.0 lb

## 2022-04-28 DIAGNOSIS — Z3041 Encounter for surveillance of contraceptive pills: Secondary | ICD-10-CM

## 2022-04-28 DIAGNOSIS — Z01419 Encounter for gynecological examination (general) (routine) without abnormal findings: Secondary | ICD-10-CM | POA: Diagnosis not present

## 2022-04-28 DIAGNOSIS — Z113 Encounter for screening for infections with a predominantly sexual mode of transmission: Secondary | ICD-10-CM | POA: Insufficient documentation

## 2022-04-28 MED ORDER — LEVONORGESTREL-ETHINYL ESTRAD 0.15-30 MG-MCG PO TABS: 1.0000 | ORAL_TABLET | Freq: Every day | ORAL | 3 refills | Status: DC

## 2022-04-28 NOTE — Patient Instructions (Signed)
I value your feedback and you entrusting us with your care. If you get a [facility] patient survey, I would appreciate you taking the time to let us know about your experience today. Thank you! ? ? ?

## 2022-04-29 LAB — CERVICOVAGINAL ANCILLARY ONLY
Chlamydia: NEGATIVE
Comment: NEGATIVE
Comment: NORMAL
Neisseria Gonorrhea: NEGATIVE

## 2022-05-26 IMAGING — CT CT L SPINE W/O CM
4 series · 16 of 33 positions shown, 18 images · IV contrast (APPLIED)
Comparison: None.

CLINICAL DATA: Back pain.

EXAM:
CT LUMBAR SPINE WITHOUT CONTRAST
TECHNIQUE: Multidetector CT imaging of the lumbar spine was performed without
intravenous contrast administration. Multiplanar CT image
reconstructions were also generated.

[Series 3: ax 2 x 2 soft tissue l-spine · axial · 0.35mm/px · z∈[-374,-234]mm · 4 of 165 slices shown]
[im 24/165  soft-tissue]
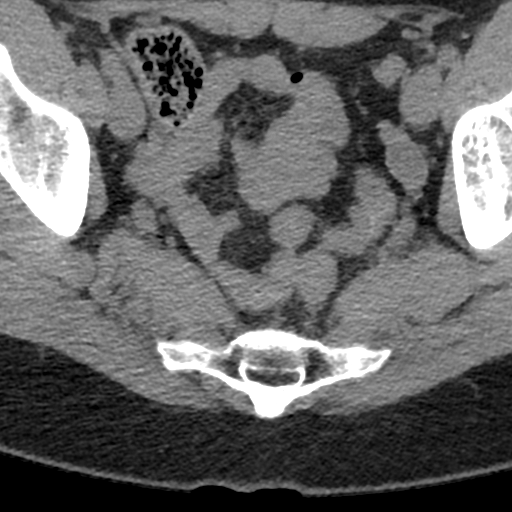
[im 47/165  soft-tissue]
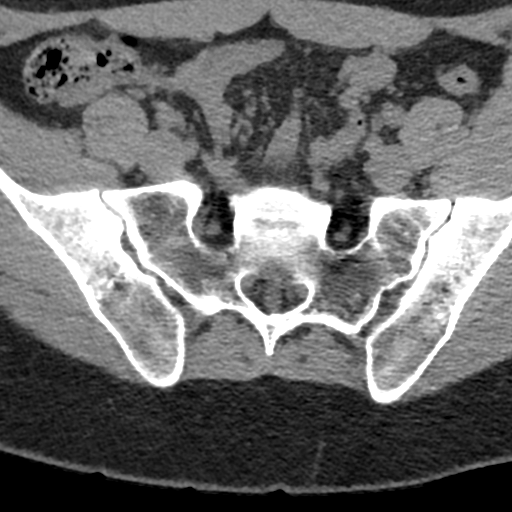
[im 71/165  soft-tissue]
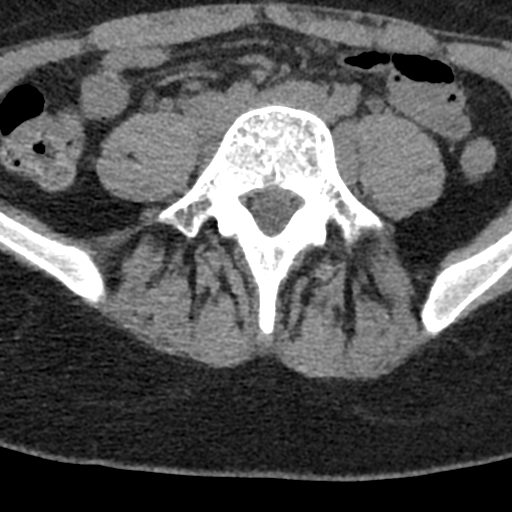
[im 94/165  soft-tissue]
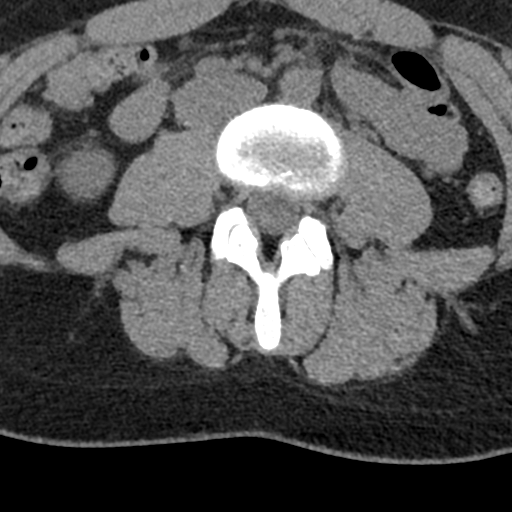

[Series 4: sag soft tissue l-spine · sagittal · 0.45mm/px · 5 of 84 slices shown, 6 images]
[im 28/84  bone]
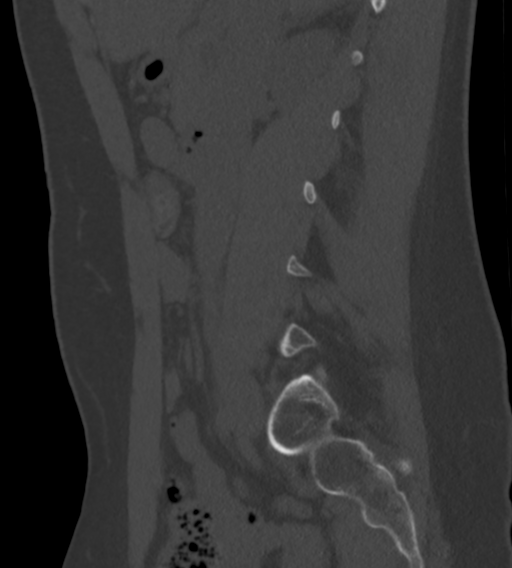
[im 35/84  bone]
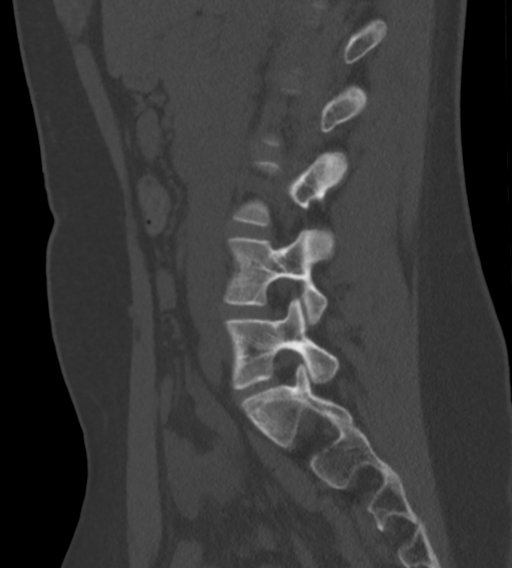
[im 42/84  soft-tissue]
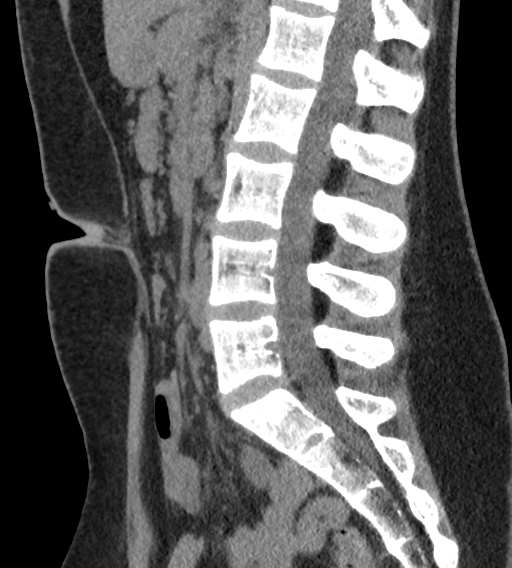
[im 42/84  bone]
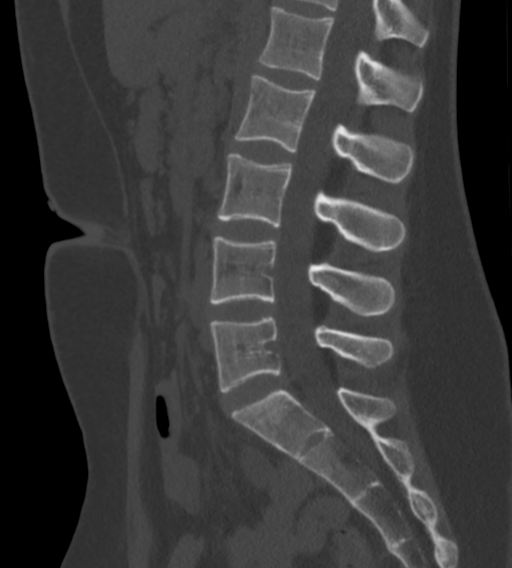
[im 49/84  bone]
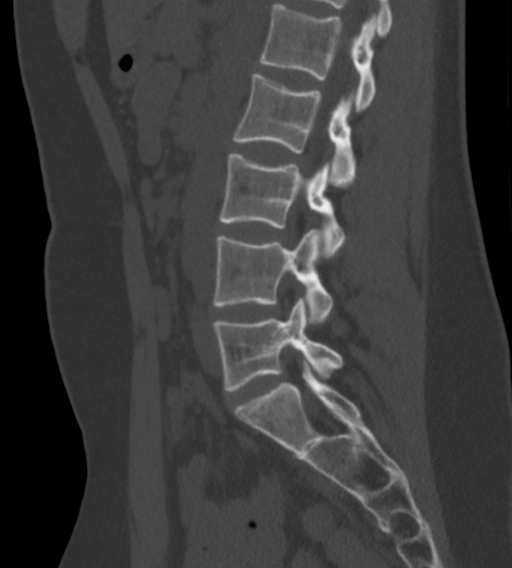
[im 56/84  bone]
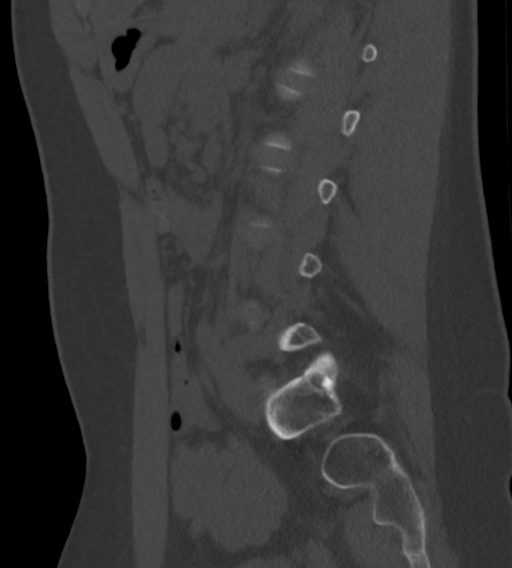

[Series 5: multi spine · axial · 0.21mm/px · z∈[-293,-143]mm · 4 of 114 slices shown, 5 images]
[im 23/114  soft-tissue]
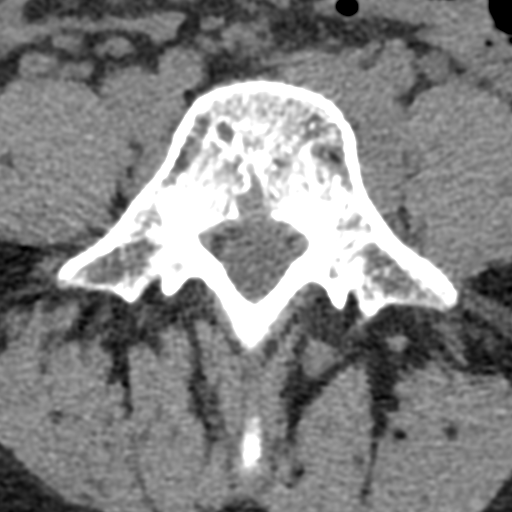
[im 23/114  bone]
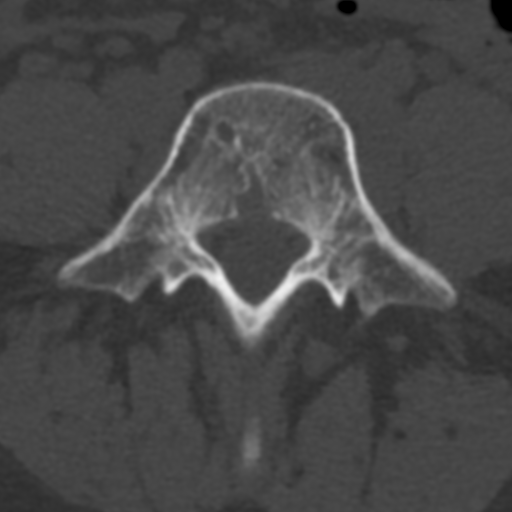
[im 46/114  bone]
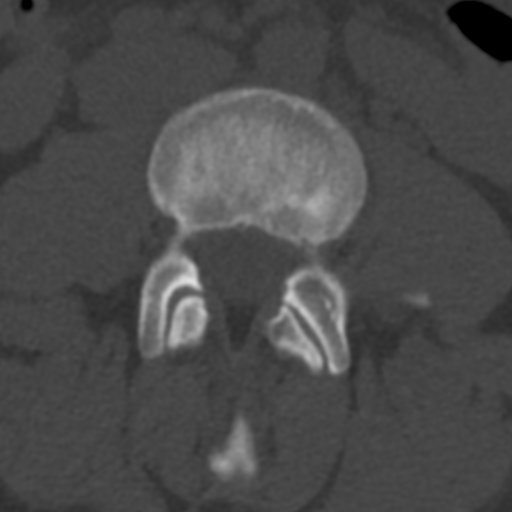
[im 68/114  bone]
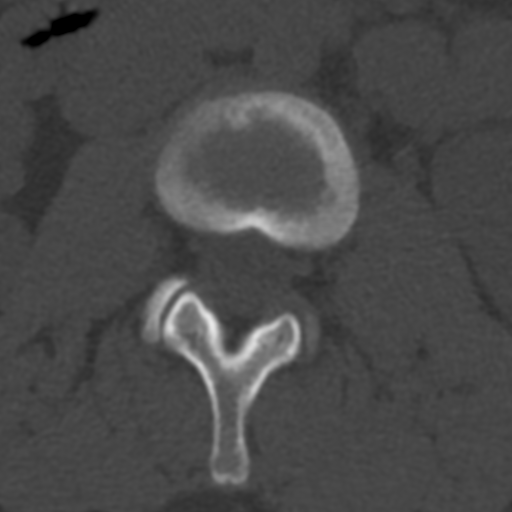
[im 91/114  bone]
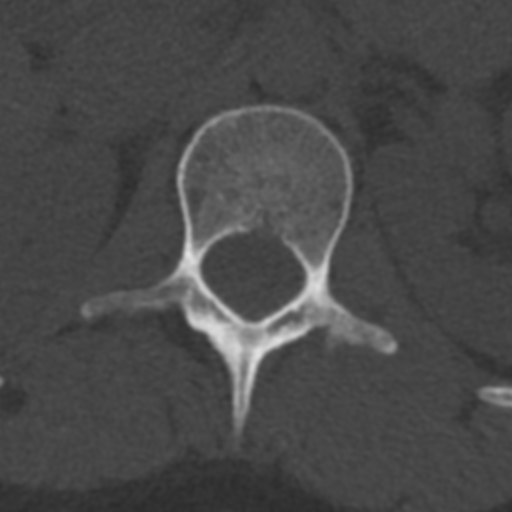

[Series 6: cor bone l-spine · coronal · 0.40mm/px · 3 of 97 slices shown]
[im 20/97  bone]
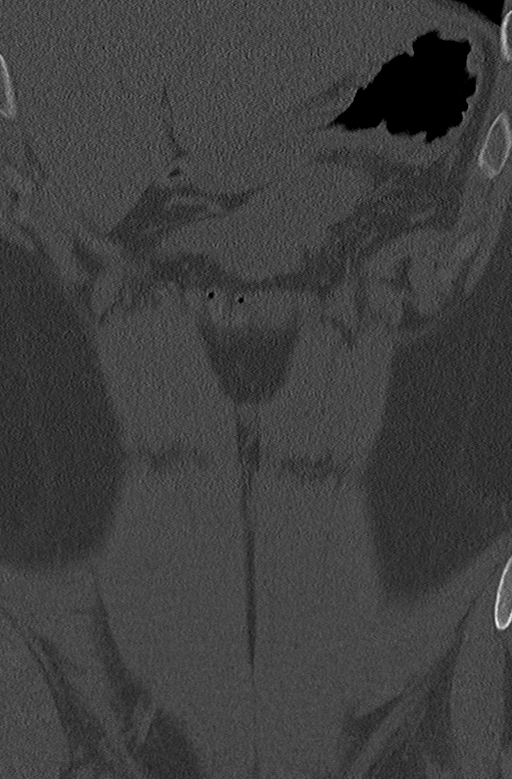
[im 39/97  bone]
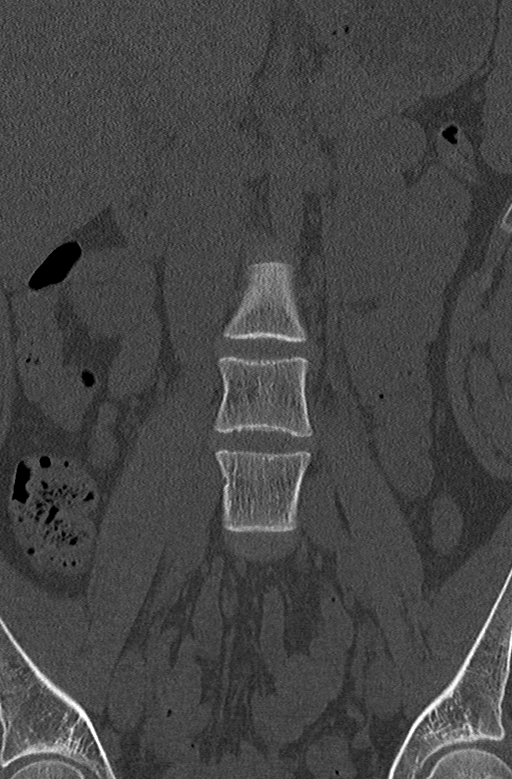
[im 58/97  bone]
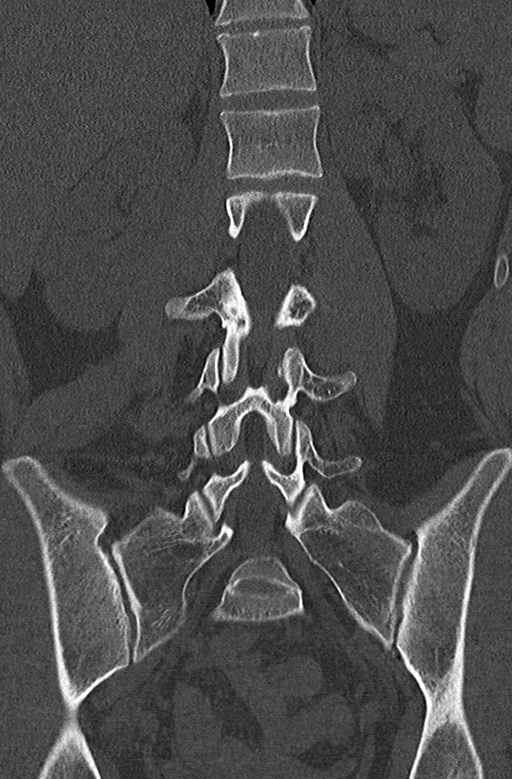

[16 of 33 positions shown; findings below may reference images not displayed]

FINDINGS: Segmentation: 5 lumbar type vertebrae.

Alignment: Normal.

Vertebrae: No acute fracture or focal pathologic process.

Paraspinal and other soft tissues: Negative.

Disc levels: No spinal canal or neural foraminal stenosis at any
level.
IMPRESSION: Unremarkable CT of the lumbar spine.

## 2022-08-01 ENCOUNTER — Encounter: Payer: Self-pay | Admitting: Obstetrics and Gynecology

## 2022-08-02 MED ORDER — DOXYCYCLINE HYCLATE 100 MG PO CAPS: 100.0000 mg | ORAL_CAPSULE | Freq: Two times a day (BID) | ORAL | 0 refills | Status: DC

## 2022-08-11 ENCOUNTER — Telehealth: Payer: Self-pay | Admitting: Obstetrics and Gynecology

## 2022-08-11 MED ORDER — FLUCONAZOLE 150 MG PO TABS: 150.0000 mg | ORAL_TABLET | Freq: Once | ORAL | 0 refills | Status: AC

## 2022-08-11 NOTE — Telephone Encounter (Signed)
Rx diflucan for yeast vag sx after abx ?

## 2022-11-01 ENCOUNTER — Encounter: Payer: Self-pay | Admitting: Obstetrics and Gynecology

## 2022-11-04 ENCOUNTER — Ambulatory Visit: Payer: Managed Care, Other (non HMO) | Admitting: Obstetrics

## 2022-11-04 ENCOUNTER — Other Ambulatory Visit (HOSPITAL_COMMUNITY)
Admission: RE | Admit: 2022-11-04 | Discharge: 2022-11-04 | Disposition: A | Discharge status: Home / Self Care | Payer: Managed Care, Other (non HMO) | Source: Ambulatory Visit | Attending: Obstetrics | Admitting: Obstetrics

## 2022-11-04 ENCOUNTER — Ambulatory Visit (INDEPENDENT_AMBULATORY_CARE_PROVIDER_SITE_OTHER): Payer: Managed Care, Other (non HMO)

## 2022-11-04 VITALS — BP 120/60 | HR 85 | Ht 64.0 in | Wt 180.0 lb

## 2022-11-04 DIAGNOSIS — Z113 Encounter for screening for infections with a predominantly sexual mode of transmission: Secondary | ICD-10-CM | POA: Diagnosis not present

## 2022-11-04 NOTE — Patient Instructions (Signed)
Chlamydia, Female  Chlamydia is a sexually transmitted infection (STI). This infection spreads through sexual contact. Chlamydia can occur in different areas of the body, including: The urethra. This is the part of the body that drains urine from the bladder. The cervix. This is the lowest part of the uterus. The throat. The rectum. This condition is not difficult to treat. However, if left untreated, chlamydia can lead to more serious health problems, including pelvic inflammatory disease (PID). PID can increase your risk of being unable to have children. In pregnant women, untreated chlamydia can cause serious complications during pregnancy or health problems for the baby after delivery. What are the causes? This condition is caused by a bacteria called Chlamydia trachomatis. The bacteria are spread from an infected partner during sexual activity. Chlamydia can spread through contact with the genitals, mouth, or rectum. What increases the risk? The following factors may make you more likely to develop this condition: Not using a condom the right way or not using a condom every time you have sex. Having a new sex partner or having more than one sex partner. Being sexually active before age 22. What are the signs or symptoms? In some cases, there are no symptoms, especially early in the infection. If symptoms develop, they may include: Urinating often, or a burning feeling during urination. Redness, soreness, or swelling of the vagina or rectum. Discharge coming from the vagina or rectum. Pain in the abdomen. Pain during sex. Bleeding between menstrual periods or irregular periods. How is this diagnosed? This condition may be diagnosed with: Urine tests. Swab tests. Depending on your symptoms, your health care provider may use a cotton swab to collect a fluid sample from your vagina, rectum, nose, or throat to test for the bacteria. A pelvic exam. How is this treated? This condition is  treated with antibiotic medicines. Follow these instructions at home: Sexual activity Tell your sex partner or partners about your infection. These include any partners for oral, anal, or vaginal sex that you have had within 60 days of when your symptoms started. Sex partners should also be treated, even if they have no signs of the infection. Do not have sex until you and your sex partners have completed treatment and your health care provider says it is okay. If your health care provider prescribed you a single-dose medicine as treatment, wait at least 7 days after taking the medicine before having sex. General instructions Take over-the-counter and prescription medicines as told by your health care provider. Finish all antibiotic medicine even when you start to feel better. It is up to you to get your test results. Ask your health care provider, or the department that is doing the test, when your results will be ready. Keep all follow-up visits. This is important. You may need to be tested for infection again 3 months after treatment. How is this prevented? You can lower your risk of getting chlamydia by: Using latex or polyurethane condoms correctly every time you have sex. Not having multiple sex partners. Asking if your sex partner has been tested for STIs and had negative results. Getting regular health screenings to check for STIs. Contact a health care provider if: You develop new symptoms or your symptoms are getting worse. Your symptoms do not get better after treatment. You have a fever or chills. You have pain during sex. You have irregular menstrual periods, or you have bleeding between periods or after sex. You develop flu-like symptoms, such as night sweats, sore throat,  or muscle aches. You are unable to take your antibiotic medicine as prescribed. Summary Chlamydia is a sexually transmitted infection (STI) that is caused by bacteria. This infection spreads through sexual  contact. This condition is treated with antibiotic medicines. If left untreated, chlamydia can lead to more serious health problems, including pelvic inflammatory disease (PID). Your sex partners will also need to be treated. Do not have sex until both you and your partner have been treated. Take medicines as directed by your health care provider and keep all follow-up visits to ensure your infection has been completely treated. This information is not intended to replace advice given to you by your health care provider. Make sure you discuss any questions you have with your health care provider. Document Revised: 10/19/2020 Document Reviewed: 10/19/2020 Elsevier Patient Education  2024 [employer] Inc[employer] Gonorrhea Gonorrhea is a sexually transmitted infection (STI) that can infect any person. If left untreated, this infection can: Damage the reproductive organs. Spread to other parts of the body. Cause someone to be unable to have children (infertility). Harm an unborn baby if an infected person is pregnant. It is important to get treatment for gonorrhea as soon as possible. All of your sex partners may also need to be treated for the infection. What are the causes? This condition is caused by bacteria called Neisseria gonorrhoeae. The infection is spread from person to person through sexual contact, including oral, anal, and vaginal sex. The infection can also pass from a pregnant person to the baby during birth. What increases the risk? The following factors may make you more likely to develop this condition: Being a woman younger than 25 years and sexually active. Being a man who has sex with men. Having a new sex partner or having multiple partners. Having a sex partner who has an STI. Not using condoms correctly or not using condoms every time you have sex. Having a history of STIs. What are the signs or symptoms? When symptoms occur, they may include: Abnormal discharge from the penis or  vagina. The discharge may be cloudy, thick, or yellow-green in color. Pain or burning when you urinate. Itching, irritation, pain, bleeding, or discharge from the rectum. This may occur if the infection was spread by anal sex. Sore throat or swollen lymph nodes in the neck. This may occur if the infection was spread by oral sex. Pain or swelling in the testicles. Bleeding between menstrual periods. If the infection has spread to other areas of the body, symptoms may include: Fever. Eye irritation. Swelling, redness, warmth, and pain in the joints. Rashes. In some cases, there are no symptoms. How is this diagnosed? This condition is diagnosed based on: A physical exam. A swab of fluid. The swab of fluid may be taken from the penis, vagina, throat, or rectum. Urine tests. Not all test results will be available during your visit. How is this treated? This condition is treated with antibiotic medicines. It is important to start treatment as soon as possible. Early treatment may prevent some problems from developing. You should not have sex during treatment. All types of sexual activity should be avoided for at least 7 days after treatment is complete and until your sex partner or partners have been treated. Follow these instructions at home: Take over-the-counter and prescription medicines as told by your health care provider. Finish all antibiotic medicine even when you start to feel better. Do not have sex during treatment. Do not have sex until at least 7 days after you  and your partner or partners have finished treatment and your health care provider says it is okay. It is up to you to get your test results. Ask your health care provider, or the department that is doing the test, when your results will be ready. If you get a positive result on your gonorrhea test, tell your recent sex partners. These include any partners for oral, anal, or vaginal sex. They need to be checked for gonorrhea  even if they do not have symptoms. They may need treatment, even if they get negative results on their gonorrhea tests. Keep all follow-up visits. This is important. How is this prevented?  Use latex or polyurethane condoms correctly every time you have sex. Ask if your sex partner or partners have been tested for STIs and had negative results. Avoid having multiple sex partners. Get regular health screenings to check for STIs. Contact a health care provider if: Your symptoms do not get better after a few days of taking antibiotics. Your symptoms get worse. You cannot take your medicine as directed by your health care provider. You develop new symptoms, including: Eye irritation. Swelling, redness, warmth, and pain in the joints. Rashes. You have a fever. Summary Gonorrhea is a sexually transmitted infection (STI) that can infect any person. This infection is spread from person to person through sexual contact, including oral, anal, and vaginal sex. The infection can also pass from a pregnant person to the baby during birth. Symptoms include abnormal discharge, pain or burning while urinating, or pain in the rectum. This condition is treated with antibiotic medicines. Do not have sex until at least 7 days after both you and any sex partners have completed antibiotic treatment. Tell your health care provider if you have trouble taking your medicine, your symptoms get worse, or you have new symptoms. Keep all follow-up visits. This information is not intended to replace advice given to you by your health care provider. Make sure you discuss any questions you have with your health care provider. Document Revised: 11/19/2020 Document Reviewed: 11/19/2020 Elsevier Patient Education  2024 Elsevier Inc. Trichomoniasis Trichomoniasis is a sexually transmitted infection (STI). Many people with trichomoniasis do not have any symptoms (are asymptomatic) or have only minimal symptoms. Untreated  trichomoniasis can last from months to years. This condition is treated with medicine. What are the causes? This condition is caused by a parasite called Trichomonas vaginalis and is transmitted during sexual contact. What increases the risk? The following factors may make you more likely to develop this condition: Having unprotected sex. Having sex with a partner who has trichomoniasis. Having multiple sexual partners. Having had previous trichomoniasis infections or other STIs. What are the signs or symptoms? In females, symptoms of trichomoniasis include: Itching, burning, redness, or soreness in the genital area. Discomfort while urinating. Abnormal vaginal discharge that is clear, white, gray, or yellow-green and foamy and has an unusual fishy odor. In males, symptoms of trichomoniasis include: Discharge from the penis. Burning after urination or ejaculation. Itching or discomfort inside the penis. How is this diagnosed? This condition is diagnosed based on tests. To perform a test, your health care provider will do one of the following: Ask you to provide a urine sample. Take a sample of discharge. The sample may be taken from the vagina or cervix in females and from the urethra in males. Your health care provider may use a swab to collect the sample. Your health care provider may test you for other STIs, including human immunodeficiency  virus (HIV). How is this treated?  This condition is treated with medicines such as metronidazole or tinidazole. These are called antimicrobial medicines, and they are taken by mouth (orally). Your sexual partner or partners also need to be tested and treated. If they have the infection and are not treated, you will likely get reinfected. If you plan to become pregnant or think you may be pregnant, tell your health care provider right away. Some medicines that are used to treat the infection should not be taken during pregnancy. Your health care  provider may recommend over-the-counter medicines or creams to help relieve itching or irritation. You may be tested for the infection again 3 months after treatment. Follow these instructions at home: Medicines Take over-the-counter and prescription medicines only as told by your health care provider. Take your antimicrobial medicine as told by your health care provider. Do not stop taking it even if you start to feel better. Use creams as told by your health care provider. General instructions Do not have sex until after you finish your medicine and your symptoms have resolved. Do not wear tampons while you have the infection (if you are female). Talk with your sexual partner or partners about any symptoms that either of you may have, as well as any history of STIs. Keep all follow-up visits. This is important. How is this prevented?  Use condoms every time you have sex. Using condoms correctly and consistently can help protect against STIs. Do not have sexual contact if you have symptoms of trichomoniasis or another STI. Avoid having multiple sexual partners. Get tested for STIs before you have sex with a partner. Ask all partners to do the same. Do not douche (if you are female). Douching may increase your risk for getting STIs due to the removal of good bacteria in the vagina. Contact a health care provider if: You still have symptoms after you finish your medicine. You develop a rash. You plan to become pregnant or think you may be pregnant. Summary Trichomoniasis is a sexually transmitted infection (STI). This condition often has no symptoms or minimal symptoms. Take your antimicrobial medicine as told by your health care provider. Do not stop taking even if you start to feel better. Discuss your infection with your sexual partner or partners. Make sure that all partners get tested and treated, if necessary. You should not have sex until after you finish your medicine and your  symptoms have resolved. Keep all follow-up visits. This is important. This information is not intended to replace advice given to you by your health care provider. Make sure you discuss any questions you have with your health care provider. Document Revised: 11/25/2020 Document Reviewed: 11/25/2020 Elsevier Patient Education  2024 [employer].

## 2022-11-04 NOTE — Progress Notes (Signed)
    NURSE VISIT NOTE  Subjective:    Patient ID: Kaylee Bates, female    DOB: [birth year], 22 y.o.   MRN: [id number]  HPI  Patient is a 22 y.o. G0P0000 female who presents for a self swab nurse visit Denies abnormal vaginal bleeding or significant pelvic pain or fever. Patient does not have history of known exposure to STD.   Objective:    BP 120/60   Pulse 85   Ht 5\' 4"  (1.626 m)   Wt 180 lb (81.6 kg)   LMP 11/02/2022 (Exact Date)   BMI 30.90 kg/m     Assessment:   1. Screening for STD (sexually transmitted disease)       Plan:   Aptima sent to lab. Treatment: Will wait for results to be treated if needed. ROV prn if symptoms persist or worsen.   Donnetta Hail, CMA

## 2022-11-09 LAB — CERVICOVAGINAL ANCILLARY ONLY
Chlamydia: NEGATIVE
Comment: NEGATIVE
Comment: NEGATIVE
Comment: NORMAL
Neisseria Gonorrhea: NEGATIVE
Trichomonas: NEGATIVE

## 2023-04-25 ENCOUNTER — Ambulatory Visit: Payer: Managed Care, Other (non HMO) | Admitting: Obstetrics and Gynecology

## 2023-05-11 NOTE — Progress Notes (Unsigned)
 PCP:  [facility]   No chief complaint on file.   HPI:      [name] is a 23 y.o. G0P0000 who LMP was No LMP recorded., presents today for her annual examination.  Her menses are regular every 28-30 days, lasting 3-4 days.  Dysmenorrhea mild, occurring first 1-2 days of flow. She does not have intermenstrual bleeding.  Sex activity: currently active; contraception - OCP (estrogen/progesterone). No pain/bleeding. Had discussed IUD at 2/24 appt but pt wants to cont pills for now.  Last Pap: 04/19/21 Results were no abnormalities Hx of STDs: none   There is no FH of breast cancer. There is no FH of ovarian cancer. The patient does not do self-breast exams.   Tobacco use: vapes daily Alcohol use: wknds No drug use.  Exercise: moderately active  She does get adequate calcium and Vitamin D in her diet. Unsure if Gardasil vaccine done--used to go to [employer] [facility].   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   Nonallopathic lesion of cervical region 09/18/2012   Nonallopathic lesion of lumbosacral region 09/18/2012   Nonallopathic lesion of thoracic region 09/18/2012   Biomechanical lesion, unspecified 09/18/2012   Sacrococcygeal disorders, not elsewhere classified 09/18/2012   Segmental and somatic dysfunction of thoracic region 09/18/2012    Past Surgical History:  Procedure Laterality Date   WISDOM TOOTH EXTRACTION      Family History  Problem Relation Age of Onset   Bladder Cancer Maternal Grandmother    Lung cancer Maternal Great-grandmother    Brain cancer Maternal Great-grandmother    Breast cancer Neg Hx    Ovarian cancer Neg Hx     Social History   Socioeconomic History   Marital status: Single    Spouse name: Not on file   Number of children: Not on file   Years of education: Not on file   Highest education  level: Not on file  Occupational History   Not on file  Tobacco Use   Smoking status: Never   Smokeless tobacco: Never  Vaping Use   Vaping status: Every Day  Substance and Sexual Activity   Alcohol use: Yes   Drug use: Never   Sexual activity: Not Currently    Birth control/protection: None  Other Topics Concern   Not on file  Social History Narrative   Not on file   Social Drivers of Health   Financial Resource Strain: Low Risk  (08/19/2022)   Received from [facility]   Overall Financial Resource Strain (CARDIA)    Difficulty of Paying Living Expenses: Not hard at all  Food Insecurity: No Food Insecurity (08/19/2022)   Received from [facility]   Hunger Vital Sign    Worried About Running Out of Food in the Last Year: Never true    Ran Out of Food in the Last Year: Never true  Transportation Needs: No Transportation Needs (08/19/2022)   Received from [facility] - [facility]    In the past 12 months, has lack of transportation kept you from medical appointments or from getting medications?: No    Lack of Transportation (Non-Medical): No  Physical Activity: Not on file  Stress: Not on file  Social Connections: Not on file  Intimate Partner Violence: Not on file     Current  Outpatient Medications:    levonorgestrel -ethinyl estradiol (NORDETTE) 0.15-30 MG-MCG tablet, Take 1 tablet by mouth daily. (Patient not taking: Reported on 11/04/2022), Disp: 84 tablet, Rfl: 3     ROS:  Review of Systems  Constitutional:  Negative for fatigue, fever and unexpected weight change.  Respiratory:  Negative for cough, shortness of breath and wheezing.   Cardiovascular:  Negative for chest pain, palpitations and leg swelling.  Gastrointestinal:  Negative for blood in stool, constipation, diarrhea, nausea and vomiting.  Endocrine: Negative for cold intolerance, heat intolerance and polyuria.  Genitourinary:  Negative  for dyspareunia, dysuria, flank pain, frequency, genital sores, hematuria, menstrual problem, pelvic pain, urgency, vaginal bleeding, vaginal discharge and vaginal pain.  Musculoskeletal:  Negative for back pain, joint swelling and myalgias.  Skin:  Negative for rash.  Neurological:  Negative for dizziness, syncope, light-headedness, numbness and headaches.  Hematological:  Negative for adenopathy.  Psychiatric/Behavioral:  Negative for agitation, confusion, sleep disturbance and suicidal ideas. The patient is not nervous/anxious.   BREAST: No symptoms   Objective: There were no vitals taken for this visit.   Physical Exam Constitutional:      Appearance: She is well-developed.  Genitourinary:     Vulva normal.     Right Labia: No rash, tenderness or lesions.    Left Labia: No tenderness, lesions or rash.    No vaginal discharge, erythema or tenderness.      Right Adnexa: not tender and no mass present.    Left Adnexa: not tender and no mass present.    No cervical friability or polyp.     Uterus is not enlarged or tender.  Breasts:    Right: No mass, nipple discharge, skin change or tenderness.     Left: No mass, nipple discharge, skin change or tenderness.  Neck:     Thyroid: No thyromegaly.  Cardiovascular:     Rate and Rhythm: Normal rate and regular rhythm.     Heart sounds: Normal heart sounds. No murmur heard. Pulmonary:     Effort: Pulmonary effort is normal.     Breath sounds: Normal breath sounds.  Abdominal:     Palpations: Abdomen is soft.     Tenderness: There is no abdominal tenderness. There is no guarding or rebound.  Musculoskeletal:        General: Normal range of motion.     Cervical back: Normal range of motion.  Lymphadenopathy:     Cervical: No cervical adenopathy.  Neurological:     General: No focal deficit present.     Mental Status: She is alert and oriented to person, place, and time.     Cranial Nerves: No cranial nerve deficit.  Skin:     General: Skin is warm and dry.  Psychiatric:        Mood and Affect: Mood normal.        Behavior: Behavior normal.        Thought Content: Thought content normal.        Judgment: Judgment normal.  Vitals reviewed.     Assessment/Plan: Encounter for annual routine gynecological examination  Screening for STD (sexually transmitted disease) - Plan: Cervicovaginal ancillary only  Encounter for surveillance of contraceptive pills - Plan: levonorgestrel -ethinyl estradiol (NORDETTE) 0.15-30 MG-MCG tablet; OCP RF eRxd. F/u prn IUD.    No orders of the defined types were placed in this encounter.            GYN counsel adequate intake of calcium and vitamin D, diet and  exercise     F/U  No follow-ups on file.  Sharetta Ricchio B. Jaquel Glassburn, PA-C 05/11/2023 1:52 PM

## 2023-05-15 ENCOUNTER — Other Ambulatory Visit (HOSPITAL_COMMUNITY)
Admission: RE | Admit: 2023-05-15 | Discharge: 2023-05-15 | Disposition: A | Discharge status: Home / Self Care | Source: Ambulatory Visit | Attending: Obstetrics and Gynecology | Admitting: Obstetrics and Gynecology

## 2023-05-15 ENCOUNTER — Ambulatory Visit (INDEPENDENT_AMBULATORY_CARE_PROVIDER_SITE_OTHER): Payer: Managed Care, Other (non HMO) | Admitting: Obstetrics and Gynecology

## 2023-05-15 ENCOUNTER — Encounter: Payer: Self-pay | Admitting: Obstetrics and Gynecology

## 2023-05-15 VITALS — BP 118/73 | HR 72 | Ht 64.0 in | Wt 187.0 lb

## 2023-05-15 DIAGNOSIS — Z3041 Encounter for surveillance of contraceptive pills: Secondary | ICD-10-CM

## 2023-05-15 DIAGNOSIS — Z113 Encounter for screening for infections with a predominantly sexual mode of transmission: Secondary | ICD-10-CM

## 2023-05-15 DIAGNOSIS — Z01419 Encounter for gynecological examination (general) (routine) without abnormal findings: Secondary | ICD-10-CM | POA: Diagnosis not present

## 2023-05-15 MED ORDER — LEVONORGESTREL-ETHINYL ESTRAD 0.15-30 MG-MCG PO TABS
1.0000 | ORAL_TABLET | Freq: Every day | ORAL | 3 refills | Status: AC
Start: 2023-05-15 — End: ?

## 2023-05-15 NOTE — Patient Instructions (Signed)
 I value your feedback and you entrusting [place] with your care. If you get a [place] patient survey, I would appreciate you taking the time to let us know about your experience today. Thank you! ? ? ?

## 2023-05-17 LAB — CERVICOVAGINAL ANCILLARY ONLY
Chlamydia: NEGATIVE
Comment: NEGATIVE
Comment: NORMAL
Neisseria Gonorrhea: NEGATIVE

## 2023-07-04 ENCOUNTER — Encounter: Payer: Self-pay | Admitting: Obstetrics and Gynecology
